# Patient Record
Sex: Female | Born: 1980 | Hispanic: Yes | Marital: Married | Smoking: Never smoker
Health system: Southern US, Community
[De-identification: ages and names within clinical notes are randomized; demographics above are authoritative.]

## PROBLEM LIST (undated history)

## (undated) DIAGNOSIS — I499 Cardiac arrhythmia, unspecified: Secondary | ICD-10-CM

## (undated) DIAGNOSIS — I10 Essential (primary) hypertension: Secondary | ICD-10-CM

## (undated) HISTORY — PX: SHOULDER SURGERY: SHX246

---

## 2006-01-30 ENCOUNTER — Emergency Department: Payer: Self-pay | Admitting: Emergency Medicine

## 2006-02-04 ENCOUNTER — Emergency Department: Payer: Self-pay | Admitting: Unknown Physician Specialty

## 2006-03-25 ENCOUNTER — Ambulatory Visit: Payer: Self-pay | Admitting: Family Medicine

## 2006-05-18 ENCOUNTER — Ambulatory Visit: Payer: Self-pay | Admitting: Family Medicine

## 2009-02-16 ENCOUNTER — Ambulatory Visit: Payer: Self-pay | Admitting: Family Medicine

## 2009-05-31 ENCOUNTER — Emergency Department: Payer: Self-pay | Admitting: Emergency Medicine

## 2009-12-11 ENCOUNTER — Emergency Department: Payer: Self-pay | Admitting: Emergency Medicine

## 2009-12-12 ENCOUNTER — Emergency Department: Payer: Self-pay | Admitting: Emergency Medicine

## 2010-07-11 ENCOUNTER — Ambulatory Visit: Payer: Self-pay | Admitting: Unknown Physician Specialty

## 2010-07-15 ENCOUNTER — Ambulatory Visit: Payer: Self-pay | Admitting: Unknown Physician Specialty

## 2012-07-28 ENCOUNTER — Emergency Department: Payer: Self-pay

## 2012-07-28 LAB — BASIC METABOLIC PANEL
Anion Gap: 6 — ABNORMAL LOW (ref 7–16)
BUN: 9 mg/dL (ref 7–18)
Calcium, Total: 8.8 mg/dL (ref 8.5–10.1)
Creatinine: 0.51 mg/dL — ABNORMAL LOW (ref 0.60–1.30)
EGFR (African American): >60
Glucose: 93 mg/dL (ref 65–99)
Sodium: 138 mmol/L (ref 136–145)

## 2012-07-28 LAB — CBC
HGB: 12.4 g/dL (ref 12.0–16.0)
MCV: 89 fL (ref 80–100)
Platelet: 267 10*3/uL (ref 150–440)
RBC: 4 10*6/uL (ref 3.80–5.20)
WBC: 4.8 10*3/uL (ref 3.6–11.0)

## 2012-07-28 LAB — TROPONIN I: Troponin-I: <0.02 ng/mL

## 2014-09-03 ENCOUNTER — Emergency Department: Payer: Self-pay | Admitting: Emergency Medicine

## 2014-09-03 LAB — COMPREHENSIVE METABOLIC PANEL
ALK PHOS: 64 U/L
ALT: 35 U/L
AST: 24 U/L (ref 15–37)
Albumin: 4.1 g/dL (ref 3.4–5.0)
Anion Gap: 10 (ref 7–16)
BILIRUBIN TOTAL: 0.3 mg/dL (ref 0.2–1.0)
BUN: 15 mg/dL (ref 7–18)
Calcium, Total: 8.9 mg/dL (ref 8.5–10.1)
Chloride: 108 mmol/L — ABNORMAL HIGH (ref 98–107)
Co2: 22 mmol/L (ref 21–32)
Creatinine: 0.6 mg/dL (ref 0.60–1.30)
Glucose: 96 mg/dL (ref 65–99)
Osmolality: 280 (ref 275–301)
POTASSIUM: 3.5 mmol/L (ref 3.5–5.1)
SODIUM: 140 mmol/L (ref 136–145)
Total Protein: 7.6 g/dL (ref 6.4–8.2)

## 2014-09-03 LAB — CBC
HCT: 38.3 % (ref 35.0–47.0)
HGB: 12.5 g/dL (ref 12.0–16.0)
MCH: 29.2 pg (ref 26.0–34.0)
MCHC: 32.6 g/dL (ref 32.0–36.0)
MCV: 90 fL (ref 80–100)
PLATELETS: 242 10*3/uL (ref 150–440)
RBC: 4.27 10*6/uL (ref 3.80–5.20)
RDW: 13.6 % (ref 11.5–14.5)
WBC: 5.8 10*3/uL (ref 3.6–11.0)

## 2014-09-03 LAB — D-DIMER(ARMC): D-Dimer: 177 ng/ml

## 2014-09-03 LAB — TROPONIN I: Troponin-I: <0.02 ng/mL

## 2014-10-06 ENCOUNTER — Emergency Department: Payer: Self-pay | Admitting: Emergency Medicine

## 2019-04-07 ENCOUNTER — Ambulatory Visit
Admission: RE | Admit: 2019-04-07 | Discharge: 2019-04-07 | Disposition: A | Discharge status: Home / Self Care | Payer: Self-pay | Source: Ambulatory Visit | Attending: Primary Care | Admitting: Primary Care

## 2019-04-07 ENCOUNTER — Other Ambulatory Visit: Payer: Self-pay | Admitting: Primary Care

## 2019-04-07 DIAGNOSIS — M25571 Pain in right ankle and joints of right foot: Secondary | ICD-10-CM

## 2019-06-09 ENCOUNTER — Other Ambulatory Visit: Payer: Self-pay

## 2019-06-09 DIAGNOSIS — Z20822 Contact with and (suspected) exposure to covid-19: Secondary | ICD-10-CM

## 2019-06-13 LAB — NOVEL CORONAVIRUS, NAA: SARS-CoV-2, NAA: DETECTED — AB

## 2019-09-08 ENCOUNTER — Encounter: Payer: Self-pay | Admitting: Emergency Medicine

## 2019-09-08 ENCOUNTER — Other Ambulatory Visit: Payer: Self-pay

## 2019-09-08 ENCOUNTER — Emergency Department: Payer: Self-pay

## 2019-09-08 ENCOUNTER — Emergency Department
Admission: EM | Admit: 2019-09-08 | Discharge: 2019-09-08 | Disposition: A | Discharge status: Home / Self Care | Payer: Self-pay | Attending: Emergency Medicine | Admitting: Emergency Medicine

## 2019-09-08 DIAGNOSIS — R519 Headache, unspecified: Secondary | ICD-10-CM | POA: Insufficient documentation

## 2019-09-08 DIAGNOSIS — I10 Essential (primary) hypertension: Secondary | ICD-10-CM | POA: Insufficient documentation

## 2019-09-08 DIAGNOSIS — R202 Paresthesia of skin: Secondary | ICD-10-CM | POA: Insufficient documentation

## 2019-09-08 DIAGNOSIS — R079 Chest pain, unspecified: Secondary | ICD-10-CM | POA: Insufficient documentation

## 2019-09-08 DIAGNOSIS — R531 Weakness: Secondary | ICD-10-CM | POA: Insufficient documentation

## 2019-09-08 DIAGNOSIS — M549 Dorsalgia, unspecified: Secondary | ICD-10-CM | POA: Insufficient documentation

## 2019-09-08 HISTORY — DX: Cardiac arrhythmia, unspecified: I49.9

## 2019-09-08 HISTORY — DX: Essential (primary) hypertension: I10

## 2019-09-08 LAB — CBC
HCT: 34.2 % — ABNORMAL LOW (ref 36.0–46.0)
Hemoglobin: 11.6 g/dL — ABNORMAL LOW (ref 12.0–15.0)
MCH: 29.7 pg (ref 26.0–34.0)
MCHC: 33.9 g/dL (ref 30.0–36.0)
MCV: 87.5 fL (ref 80.0–100.0)
Platelets: 268 10*3/uL (ref 150–400)
RBC: 3.91 MIL/uL (ref 3.87–5.11)
RDW: 13.5 % (ref 11.5–15.5)
WBC: 3.7 10*3/uL — ABNORMAL LOW (ref 4.0–10.5)
nRBC: 0 % (ref 0.0–0.2)

## 2019-09-08 LAB — BASIC METABOLIC PANEL
Anion gap: 9 (ref 5–15)
BUN: 10 mg/dL (ref 6–20)
CO2: 28 mmol/L (ref 22–32)
Calcium: 9 mg/dL (ref 8.9–10.3)
Chloride: 102 mmol/L (ref 98–111)
Creatinine, Ser: 0.74 mg/dL (ref 0.44–1.00)
GFR calc Af Amer: >60 mL/min (ref >60)
GFR calc non Af Amer: >60 mL/min (ref >60)
Glucose, Bld: 94 mg/dL (ref 70–99)
Potassium: 3.6 mmol/L (ref 3.5–5.1)
Sodium: 139 mmol/L (ref 135–145)

## 2019-09-08 LAB — POCT PREGNANCY, URINE: Preg Test, Ur: NEGATIVE

## 2019-09-08 MED ORDER — METOCLOPRAMIDE HCL 5 MG/ML IJ SOLN
10.0000 mg | Freq: Once | INTRAMUSCULAR | Status: AC
  Administered 2019-09-08: 13:00:00 10 mg via INTRAVENOUS
  Filled 2019-09-08: qty 2

## 2019-09-08 MED ORDER — SODIUM CHLORIDE 0.9 % IV SOLN
Freq: Once | INTRAVENOUS | Status: AC
  Administered 2019-09-08: 13:00:00 via INTRAVENOUS

## 2019-09-08 MED ORDER — SODIUM CHLORIDE 0.9% FLUSH: 3.0000 mL | Freq: Once | INTRAVENOUS | Status: DC

## 2019-09-08 MED ORDER — KETOROLAC TROMETHAMINE 30 MG/ML IJ SOLN
30.0000 mg | Freq: Once | INTRAMUSCULAR | Status: AC
  Administered 2019-09-08: 13:00:00 30 mg via INTRAVENOUS
  Filled 2019-09-08: qty 1

## 2019-09-08 MED ORDER — LORAZEPAM 1 MG PO TABS: 1.0000 mg | ORAL_TABLET | Freq: Two times a day (BID) | ORAL | 0 refills | Status: AC | PRN

## 2019-09-08 NOTE — ED Triage Notes (Signed)
Says left side headache stabbing for 2 days.  Also says she feels weak in left arm and left face.  She says she gets chest pains on and off too.  Speech is clear.  Says both legs a little numb.  No arm drift.  No facial droop.  Also told ems she had some back pain.

## 2019-09-08 NOTE — ED Provider Notes (Signed)
[facility] Emergency Department Provider Note       Time seen: ----------------------------------------- 11:38 AM on 09/08/2019 -----------------------------------------   I have reviewed the triage vital signs and the nursing notes.  HISTORY   Chief Complaint Weakness and Headache    HPI Melinda Morris is a 38 y.o. female with a history of hypertension and irregular heartbeat who presents to the ED for left-sided headache with stabbing for the past 2 days.  Patient also states she feels weak in the left side of her arm and face.  She gets chest pain on and off as well, says both legs are little numb.  Did report some back pain to EMS.  Discomfort is 8 out of 10.  Past Medical History:  Diagnosis Date  . Hypertension   . Irregular heart beat     There are no active problems to display for this patient.   Past Surgical History:  Procedure Laterality Date  . SHOULDER SURGERY Left     Allergies Patient has no known allergies.  Social History Social History   Tobacco Use  . Smoking status: Never Smoker  . Smokeless tobacco: Never Used  Substance Use Topics  . Alcohol use: Yes  . Drug use: Not on file    Review of Systems Constitutional: Negative for fever. Cardiovascular: Positive for chest pain Respiratory: Negative for shortness of breath. Gastrointestinal: Negative for abdominal pain, vomiting and diarrhea. Musculoskeletal: Positive for back pain Skin: Negative for rash. Neurological: Positive for headache, weakness, numbness  All systems negative/normal/unremarkable except as stated in the HPI  ____________________________________________   PHYSICAL EXAM:  VITAL SIGNS: ED Triage Vitals  Enc Vitals Group     BP 09/08/19 1054 115/78     Pulse Rate 09/08/19 1054 67     Resp 09/08/19 1054 14     Temp 09/08/19 1054 98.5 F (36.9 C)     Temp Source 09/08/19 1054 Oral     SpO2 09/08/19 1054 97 %     Weight 09/08/19  1055 153 lb (69.4 kg)     Height 09/08/19 1055 5\' 3"  (1.6 m)     Head Circumference --      Peak Flow --      Pain Score 09/08/19 1055 8     Pain Loc --      Pain Edu? --      Excl. in [place]? --    Constitutional: Alert and oriented. Well appearing and in no distress. Eyes: Conjunctivae are normal. Normal extraocular movements. ENT      Head: Normocephalic and atraumatic.      Nose: No congestion/rhinnorhea.      Mouth/Throat: Mucous membranes are moist.      Neck: No stridor. Cardiovascular: Normal rate, regular rhythm. No murmurs, rubs, or gallops. Respiratory: Normal respiratory effort without tachypnea nor retractions. Breath sounds are clear and equal bilaterally. No wheezes/rales/rhonchi. Gastrointestinal: Soft and nontender. Normal bowel sounds Musculoskeletal: Nontender with normal range of motion in extremities. No lower extremity tenderness nor edema. Neurologic:  Normal speech and language. No gross focal neurologic deficits are appreciated.  Skin:  Skin is warm, dry and intact. No rash noted. Psychiatric: Mood and affect are normal. Speech and behavior are normal.  ____________________________________________  EKG: Interpreted by me.  Sinus rhythm with a rate of 63 bpm, normal PR interval, normal QRS, normal QT  ____________________________________________  ED COURSE:  As part of my medical decision making, I reviewed the following data within the [place]  History obtained from family if available, nursing notes, old chart and ekg, as well as notes from prior ED visits. Patient presented for headache with numbness and weakness, we will assess with labs and imaging as indicated at this time.   Procedures  Melinda Morris was evaluated in Emergency Department on 09/08/2019 for the symptoms described in the history of present illness. She was evaluated in the context of the global COVID-19 pandemic, which necessitated consideration that the patient  might be at risk for infection with the SARS-CoV-2 virus that causes COVID-19. Institutional protocols and algorithms that pertain to the evaluation of patients at risk for COVID-19 are in a state of rapid change based on information released by regulatory bodies including the CDC and federal and state organizations. These policies and algorithms were followed during the patient's care in the ED.  ____________________________________________   LABS (pertinent positives/negatives)  Labs Reviewed  CBC - Abnormal; Notable for the following components:      Result Value   WBC 3.7 (*)    Hemoglobin 11.6 (*)    HCT 34.2 (*)    All other components within normal limits  BASIC METABOLIC PANEL  POC URINE PREG, ED  POCT PREGNANCY, URINE    RADIOLOGY Images were viewed by me  CT head IMPRESSION:  Mucosal thickening in several ethmoid air cells. Study otherwise  unremarkable.  ____________________________________________   DIFFERENTIAL DIAGNOSIS   CVA, TIA, anxiety, migraine  FINAL ASSESSMENT AND PLAN  Headache, numbness, weakness   Plan: The patient had presented for headache with numbness and weakness. Patient's labs were unremarkable. Patient's imaging did not reveal any acute process.  Currently she is feeling better and has no numbness or paresthesias.  She appears cleared for close outpatient follow-up with her doctor.   Ulice Dash, MD    Note: This note was generated in part or whole with voice recognition software. Voice recognition is usually quite accurate but there are transcription errors that can and very often do occur. I apologize for any typographical errors that were not detected and corrected.     Emily Filbert, MD 09/08/19 1415

## 2019-09-08 NOTE — ED Notes (Signed)
Patient transported to CT 

## 2019-12-01 ENCOUNTER — Other Ambulatory Visit
Admission: RE | Admit: 2019-12-01 | Discharge: 2019-12-01 | Disposition: A | Discharge status: Home / Self Care | Payer: Self-pay | Source: Ambulatory Visit | Attending: Pediatrics | Admitting: Pediatrics

## 2019-12-01 DIAGNOSIS — R0602 Shortness of breath: Secondary | ICD-10-CM | POA: Insufficient documentation

## 2019-12-01 DIAGNOSIS — R5383 Other fatigue: Secondary | ICD-10-CM | POA: Insufficient documentation

## 2019-12-01 DIAGNOSIS — R42 Dizziness and giddiness: Secondary | ICD-10-CM | POA: Insufficient documentation

## 2019-12-01 LAB — FIBRIN DERIVATIVES D-DIMER (ARMC ONLY): Fibrin derivatives D-dimer (ARMC): 398.91 ng/mL (FEU) (ref 0.00–499.00)

## 2020-05-17 ENCOUNTER — Observation Stay (HOSPITAL_BASED_OUTPATIENT_CLINIC_OR_DEPARTMENT_OTHER)
Admit: 2020-05-17 | Discharge: 2020-05-17 | Disposition: A | Discharge status: Home / Self Care | Payer: Self-pay | Attending: Internal Medicine | Admitting: Internal Medicine

## 2020-05-17 ENCOUNTER — Observation Stay: Payer: Self-pay

## 2020-05-17 ENCOUNTER — Encounter: Payer: Self-pay | Admitting: Radiology

## 2020-05-17 ENCOUNTER — Observation Stay
Admission: EM | Admit: 2020-05-17 | Discharge: 2020-05-18 | Disposition: A | Discharge status: Home / Self Care | Payer: Self-pay | Attending: Internal Medicine | Admitting: Internal Medicine

## 2020-05-17 ENCOUNTER — Emergency Department: Payer: Self-pay

## 2020-05-17 ENCOUNTER — Other Ambulatory Visit: Payer: Self-pay

## 2020-05-17 DIAGNOSIS — E785 Hyperlipidemia, unspecified: Secondary | ICD-10-CM

## 2020-05-17 DIAGNOSIS — Z20822 Contact with and (suspected) exposure to covid-19: Secondary | ICD-10-CM | POA: Insufficient documentation

## 2020-05-17 DIAGNOSIS — F419 Anxiety disorder, unspecified: Secondary | ICD-10-CM

## 2020-05-17 DIAGNOSIS — R29898 Other symptoms and signs involving the musculoskeletal system: Secondary | ICD-10-CM | POA: Insufficient documentation

## 2020-05-17 DIAGNOSIS — G459 Transient cerebral ischemic attack, unspecified: Principal | ICD-10-CM | POA: Diagnosis present

## 2020-05-17 DIAGNOSIS — Z8249 Family history of ischemic heart disease and other diseases of the circulatory system: Secondary | ICD-10-CM | POA: Insufficient documentation

## 2020-05-17 DIAGNOSIS — I10 Essential (primary) hypertension: Secondary | ICD-10-CM | POA: Diagnosis present

## 2020-05-17 DIAGNOSIS — Z79899 Other long term (current) drug therapy: Secondary | ICD-10-CM | POA: Insufficient documentation

## 2020-05-17 DIAGNOSIS — I6389 Other cerebral infarction: Secondary | ICD-10-CM

## 2020-05-17 DIAGNOSIS — R200 Anesthesia of skin: Secondary | ICD-10-CM

## 2020-05-17 LAB — COMPREHENSIVE METABOLIC PANEL
ALT: 29 U/L (ref 0–44)
AST: 22 U/L (ref 15–41)
Albumin: 4.8 g/dL (ref 3.5–5.0)
Alkaline Phosphatase: 55 U/L (ref 38–126)
Anion gap: 10 (ref 5–15)
BUN: 10 mg/dL (ref 6–20)
CO2: 24 mmol/L (ref 22–32)
Calcium: 9.2 mg/dL (ref 8.9–10.3)
Chloride: 100 mmol/L (ref 98–111)
Creatinine, Ser: 0.6 mg/dL (ref 0.44–1.00)
GFR calc Af Amer: >60 mL/min (ref >60)
GFR calc non Af Amer: >60 mL/min (ref >60)
Glucose, Bld: 94 mg/dL (ref 70–99)
Potassium: 3.7 mmol/L (ref 3.5–5.1)
Sodium: 134 mmol/L — ABNORMAL LOW (ref 135–145)
Total Bilirubin: 0.7 mg/dL (ref 0.3–1.2)
Total Protein: 7.9 g/dL (ref 6.5–8.1)

## 2020-05-17 LAB — DIFFERENTIAL
Abs Immature Granulocytes: 0.02 10*3/uL (ref 0.00–0.07)
Basophils Absolute: 0 10*3/uL (ref 0.0–0.1)
Basophils Relative: 1 %
Eosinophils Absolute: 0.1 10*3/uL (ref 0.0–0.5)
Eosinophils Relative: 1 %
Immature Granulocytes: 0 %
Lymphocytes Relative: 39 %
Lymphs Abs: 2.4 10*3/uL (ref 0.7–4.0)
Monocytes Absolute: 0.5 10*3/uL (ref 0.1–1.0)
Monocytes Relative: 8 %
Neutro Abs: 3.1 10*3/uL (ref 1.7–7.7)
Neutrophils Relative %: 51 %

## 2020-05-17 LAB — CBC
HCT: 38.9 % (ref 36.0–46.0)
Hemoglobin: 13.4 g/dL (ref 12.0–15.0)
MCH: 30.5 pg (ref 26.0–34.0)
MCHC: 34.4 g/dL (ref 30.0–36.0)
MCV: 88.6 fL (ref 80.0–100.0)
Platelets: 279 10*3/uL (ref 150–400)
RBC: 4.39 MIL/uL (ref 3.87–5.11)
RDW: 13.3 % (ref 11.5–15.5)
WBC: 6 10*3/uL (ref 4.0–10.5)
nRBC: 0 % (ref 0.0–0.2)

## 2020-05-17 LAB — ECHOCARDIOGRAM COMPLETE
Height: 64 in
Weight: 2240 oz

## 2020-05-17 LAB — PROTIME-INR
INR: 1 (ref 0.8–1.2)
Prothrombin Time: 12.4 seconds (ref 11.4–15.2)

## 2020-05-17 LAB — SARS CORONAVIRUS 2 BY RT PCR (HOSPITAL ORDER, PERFORMED IN ~~LOC~~ HOSPITAL LAB): SARS Coronavirus 2: NEGATIVE

## 2020-05-17 LAB — APTT: aPTT: 31 seconds (ref 24–36)

## 2020-05-17 LAB — GLUCOSE, CAPILLARY: Glucose-Capillary: 93 mg/dL (ref 70–99)

## 2020-05-17 MED ORDER — ASPIRIN EC 325 MG PO TBEC
325.0000 mg | DELAYED_RELEASE_TABLET | Freq: Every day | ORAL | Status: DC
  Administered 2020-05-17 – 2020-05-18 (×2): 325 mg via ORAL
  Filled 2020-05-17 (×2): qty 1

## 2020-05-17 MED ORDER — ATORVASTATIN CALCIUM 20 MG PO TABS
80.0000 mg | ORAL_TABLET | Freq: Every day | ORAL | Status: DC
  Administered 2020-05-17 – 2020-05-18 (×2): 80 mg via ORAL
  Filled 2020-05-17 (×2): qty 4

## 2020-05-17 MED ORDER — ASPIRIN 300 MG RE SUPP: 300.0000 mg | Freq: Every day | RECTAL | Status: DC

## 2020-05-17 MED ORDER — ACETAMINOPHEN 650 MG RE SUPP: 650.0000 mg | RECTAL | Status: DC | PRN

## 2020-05-17 MED ORDER — IOHEXOL 350 MG/ML SOLN
75.0000 mL | Freq: Once | INTRAVENOUS | Status: AC | PRN
  Administered 2020-05-17: 75 mL via INTRAVENOUS

## 2020-05-17 MED ORDER — ACETAMINOPHEN 160 MG/5ML PO SOLN
650.0000 mg | ORAL | Status: DC | PRN
  Filled 2020-05-17: qty 20.3

## 2020-05-17 MED ORDER — STROKE: EARLY STAGES OF RECOVERY BOOK: Freq: Once | Status: AC

## 2020-05-17 MED ORDER — SENNOSIDES-DOCUSATE SODIUM 8.6-50 MG PO TABS: 1.0000 | ORAL_TABLET | Freq: Every evening | ORAL | Status: DC | PRN

## 2020-05-17 MED ORDER — SODIUM CHLORIDE 0.9% FLUSH: 3.0000 mL | Freq: Once | INTRAVENOUS | Status: DC

## 2020-05-17 MED ORDER — ACETAMINOPHEN 325 MG PO TABS: 650.0000 mg | ORAL_TABLET | ORAL | Status: DC | PRN

## 2020-05-17 NOTE — ED Provider Notes (Signed)
[facility] Emergency Department Provider Note ____________________________________________   First MD Initiated Contact with Patient 05/17/20 [phone]     (approximate)  I have reviewed the triage vital signs and the nursing notes.   HISTORY  Chief Complaint Numbness    HPI Melinda Morris is a 39 y.o. female with PMH as noted below who presents with numbness to the lower lip as well as to the left arm and leg, acute onset this morning around 8 AM, and persistent since then.  She has no associated weakness.  She denies any prior history of the symptoms.  Past Medical History:  Diagnosis Date  . Hypertension   . Irregular heart beat     There are no problems to display for this patient.   Past Surgical History:  Procedure Laterality Date  . SHOULDER SURGERY Left     Prior to Admission medications   Medication Sig Start Date End Date Taking? Authorizing Provider  LORazepam (ATIVAN) 1 MG tablet Take 1 tablet (1 mg total) by mouth 2 (two) times daily as needed for anxiety. 09/08/19 09/07/20 Yes Earleen Newport, MD  metoprolol succinate (TOPROL-XL) 25 MG 24 hr tablet Take by mouth. Patient not taking: Reported on 05/17/2020 05/16/16   [provider]    Allergies Patient has no known allergies.  History reviewed. No pertinent family history.  Social History Social History   Tobacco Use  . Smoking status: Never Smoker  . Smokeless tobacco: Never Used  Substance Use Topics  . Alcohol use: Yes  . Drug use: Not on file    Review of Systems  Constitutional: No fever/chills. Eyes: No visual changes. ENT: No neck pain. Cardiovascular: Denies chest pain. Respiratory: Denies shortness of breath. Gastrointestinal: No vomiting. Genitourinary: Negative for flank pain. Musculoskeletal: Negative for back pain. Skin: Negative for rash. Neurological: Positive for left-sided headache as well as left-sided  numbness.   ____________________________________________   PHYSICAL EXAM:  VITAL SIGNS: ED Triage Vitals  Enc Vitals Group     BP 05/17/20 0908 (!) 137/92     Pulse Rate 05/17/20 0908 83     Resp 05/17/20 0908 16     Temp 05/17/20 0908 98.8 F (37.1 C)     Temp Source 05/17/20 0908 Oral     SpO2 05/17/20 0908 100 %     Weight 05/17/20 0931 140 lb (63.5 kg)     Height 05/17/20 0931 5\' 4"  (1.626 m)     Head Circumference --      Peak Flow --      Pain Score 05/17/20 0931 8     Pain Loc --      Pain Edu? --      Excl. in [place]? --     Constitutional: Alert and oriented. Well appearing and in no acute distress. Eyes: Conjunctivae are normal.  Head: Atraumatic. Nose: No congestion/rhinnorhea. Mouth/Throat: Mucous membranes are moist.   Neck: Normal range of motion.  Cardiovascular: Normal rate, regular rhythm.  Good peripheral circulation. Respiratory: Normal respiratory effort.  No retractions.  Gastrointestinal: No distention.  Musculoskeletal: No lower extremity edema.  Extremities warm and well perfused.  Neurologic:  Normal speech and language.  5/5 motor strength to bilateral upper and lower extremities.  No pronator drift.  No ataxia.  No facial droop.  Subjective decreased sensation to the left upper extremity. Skin:  Skin is warm and dry. No rash noted. Psychiatric: Mood and affect are normal. Speech and behavior are normal.  ____________________________________________  LABS (all labs ordered are listed, but only abnormal results are displayed)  Labs Reviewed  COMPREHENSIVE METABOLIC PANEL - Abnormal; Notable for the following components:      Result Value   Sodium 134 (*)    All other components within normal limits  SARS CORONAVIRUS 2 BY RT PCR (HOSPITAL ORDER, PERFORMED IN [facility])  PROTIME-INR  APTT  CBC  DIFFERENTIAL  GLUCOSE, CAPILLARY  CBG MONITORING, ED  POC URINE PREG, ED    ____________________________________________  EKG  ED ECG REPORT I, Dionne Bucy, the attending physician, personally viewed and interpreted this ECG.  Date: 05/17/2020 EKG Time: 0930 Rate: 77 Rhythm: normal sinus rhythm QRS Axis: normal Intervals: normal ST/T Wave abnormalities: normal Narrative Interpretation: no evidence of acute ischemia  ____________________________________________  RADIOLOGY  CT head: No ICH or other acute abnormality CT angio head and neck: No large vessel occlusion ____________________________________________   PROCEDURES  Procedure(s) performed: No  Procedures  Critical Care performed: No ____________________________________________   INITIAL IMPRESSION / ASSESSMENT AND PLAN / ED COURSE  Pertinent labs & imaging results that were available during my care of the patient were reviewed by me and considered in my medical decision making (see chart for details).  39year-old female with no prior stroke history presents with acute onset of left-sided paresthesias in the left lip and mainly in the left arm although she states it involves the leg as well.  She has no motor weakness.  She denies any prior history of the symptoms.  On exam, the patient is overall well-appearing.  Her vital signs are normal.  Neurologic exam is as above with only subjective sensory symptoms.  Differential includes stroke, atypical migraine, peripheral neuropathy/radiculopathy, or other benign etiology.  Code stroke was activated and a CT head was obtained which is negative.  Dr. Thad Ranger evaluated the patient in the ED and I consulted with her.  She does not recommend TPA.  CT angio head and neck are also negative, Dr. Thad Ranger recommended admission for further work-up.  ----------------------------------------- 11:18 AM on 05/17/2020 -----------------------------------------  The patient reports persistent sensory symptoms.  There is no change in her exam.   I discussed the case with Dr. Joylene Igo from the hospitalist service for admission.  ____________________________________________   FINAL CLINICAL IMPRESSION(S) / ED DIAGNOSES  Final diagnoses:  Numbness      NEW MEDICATIONS STARTED DURING THIS VISIT:  New Prescriptions   No medications on file     Note:  This document was prepared using Dragon voice recognition software and may include unintentional dictation errors.    Dionne Bucy, MD 05/17/20 1118

## 2020-05-17 NOTE — Progress Notes (Signed)
SLP Cancellation Note  Patient Details Name: Melinda Morris MRN: [id number] DOB: 1981/02/03   Cancelled treatment:       Reason Eval/Treat Not Completed: SLP screened, no needs identified, will sign off (chart reviewed; consulted NSG then met w/ pt in room/ED). Pt denied any difficulty swallowing and is currently on a regular diet per NSG; tolerates swallowing pills w/ water per NSG. Pt gave her Dinner meal request to SLP who called Dining services. Pt conversed at conversational level w/out deficits noted; pt and NSG denied any speech-language deficits. Pt conversed in quite adequate [place] w/ SLP and stated she was able to text and talk on phone w/out deficits.  No further skilled ST services indicated as pt appears at her baseline. Pt agreed. NSG to reconsult if any new issues arise while admitted.    Orinda Kenner, MS, CCC-SLP Ronnett Pullin 05/17/2020, 4:22 PM

## 2020-05-17 NOTE — ED Notes (Signed)
Pt assisted to the bathroom at this time.

## 2020-05-17 NOTE — Progress Notes (Signed)
CODE STROKE- PHARMACY COMMUNICATION   Time CODE STROKE called/page received: 0908  Time response to CODE STROKE was made (in person or via phone): 0920  Time Stroke Kit retrieved from [employer]: 0920  Name of Provider/Nurse contacted: Dr. Doy Mince - no tPA    Tawnya Crook ,PharmD Clinical Pharmacist  05/17/2020  11:07 AM

## 2020-05-17 NOTE — Progress Notes (Signed)
*  PRELIMINARY RESULTS* Echocardiogram 2D Echocardiogram has been performed.  Cristela Blue 05/17/2020, 1:36 PM

## 2020-05-17 NOTE — ED Notes (Signed)
Admitting MD at bedside.

## 2020-05-17 NOTE — ED Notes (Signed)
Pt's blood tubes white labeled due to pt being a code stroke and pt's chart on [employer] manager was locked.

## 2020-05-17 NOTE — Consult Note (Signed)
Requesting Physician: Siadecki    Chief Complaint: Left sided numbness  I have been asked by Dr. Marisa Severin to see this patient in consultation for code stroke.  HPI: Melinda Morris is an 39 y.o. female with a history of HTN and irregulat heart beat who reports that she was at work today and began to have palpitations and dizziness.  Went to check her BP and it was mildly elevated.  At that point patient noted headache (left sided), numbness on the left side of her face, left arm and left leg.  Patient presented for evaluation at that time.  Initial NIHSS of 1.    Date last known well: 05/17/2020 Time last known well: Time: 08:00 tPA Given: No: Nondisabling symptoms  Past Medical History:  Diagnosis Date   Hypertension    Irregular heart beat     Past Surgical History:  Procedure Laterality Date   SHOULDER SURGERY Left     Family history: Both parents living.  Mother with DM and HTN  Social History:  reports that she has never smoked. She has never used smokeless tobacco. She reports current alcohol use. No history on file for drug use.  Allergies: No Known Allergies  Medications: I have reviewed the patient's current medications.   Prior to Admission medications   Medication Sig Start Date End Date Taking? Authorizing Provider  LORazepam (ATIVAN) 1 MG tablet Take 1 tablet (1 mg total) by mouth 2 (two) times daily as needed for anxiety. 09/08/19 09/07/20 Yes Emily Filbert, MD  metoprolol succinate (TOPROL-XL) 25 MG 24 hr tablet Take by mouth. Patient not taking: Reported on 05/17/2020 05/16/16   [provider]    ROS: History obtained from the patient  General ROS: negative for - chills, fatigue, fever, night sweats, weight gain or weight loss Psychological ROS: negative for - behavioral disorder, hallucinations, memory difficulties, mood swings or suicidal ideation Ophthalmic ROS: negative for - blurry vision, double vision, eye pain or loss of  vision ENT ROS: negative for - epistaxis, nasal discharge, oral lesions, sore throat, tinnitus or vertigo Allergy and Immunology ROS: negative for - hives or itchy/watery eyes Hematological and Lymphatic ROS: negative for - bleeding problems, bruising or swollen lymph nodes Endocrine ROS: negative for - galactorrhea, hair pattern changes, polydipsia/polyuria or temperature intolerance Respiratory ROS: negative for - cough, hemoptysis, shortness of breath or wheezing Cardiovascular ROS: palpitations Gastrointestinal ROS: negative for - abdominal pain, diarrhea, hematemesis, nausea/vomiting or stool incontinence Genito-Urinary ROS: negative for - dysuria, hematuria, incontinence or urinary frequency/urgency Musculoskeletal ROS: negative for - joint swelling or muscular weakness Neurological ROS: as noted in HPI Dermatological ROS: negative for rash and skin lesion changes  Physical Examination: Blood pressure (!) 137/92, pulse 83, temperature 98.8 F (37.1 C), temperature source Oral, resp. rate 16, height 5\' 4"  (1.626 m), weight 63.5 kg, last menstrual period 04/17/2020, SpO2 100 %.  HEENT-  Normocephalic, no lesions, without obvious abnormality.  Normal external eye and conjunctiva.  Normal TM's bilaterally.  Normal auditory canals and external ears. Normal external nose, mucus membranes and septum.  Normal pharynx. Cardiovascular- S1, S2 normal, pulses palpable throughout   Lungs- chest clear, no wheezing, rales, normal symmetric air entry Abdomen- soft, non-tender; bowel sounds normal; no masses,  no organomegaly Extremities- no edema Lymph-no adenopathy palpable Musculoskeletal-no joint tenderness, deformity or swelling Skin-warm and dry, no hyperpigmentation, vitiligo, or suspicious lesions  Neurological Examination   Mental Status: Alert, oriented, thought content appropriate.  Speech fluent without evidence of aphasia.  Able to follow 3 step commands without difficulty. Cranial  Nerves: II: Discs flat bilaterally; Visual fields grossly normal, pupils equal, round, reactive to light and accommodation III,IV, VI: ptosis not present, extra-ocular motions intact bilaterally V,VII: smile symmetric, facial light touch sensation decreased on the left VIII: hearing normal bilaterally IX,X: gag reflex present XI: bilateral shoulder shrug XII: midline tongue extension Motor: Right : Upper extremity   5/5    Left:     Upper extremity   5/5  Lower extremity   5/5     Lower extremity   5/5 Tone and bulk:normal tone throughout; no atrophy noted Sensory: Pinprick and light touch decreased in the left upper and lower extremities Deep Tendon Reflexes: Symmetric throughout Plantars: Right: mute   Left: mute Cerebellar: Normal finger-to-nose and normal heel-to-shin testing bilaterally Gait: normal gait and station   Laboratory Studies:  Basic Metabolic Panel: Recent Labs  Lab 05/17/20 0908  NA 134*  K 3.7  CL 100  CO2 24  GLUCOSE 94  BUN 10  CREATININE 0.60  CALCIUM 9.2    Liver Function Tests: Recent Labs  Lab 05/17/20 0908  AST 22  ALT 29  ALKPHOS 55  BILITOT 0.7  PROT 7.9  ALBUMIN 4.8   No results for input(s): LIPASE, AMYLASE in the last 168 hours. No results for input(s): AMMONIA in the last 168 hours.  CBC: Recent Labs  Lab 05/17/20 0908  WBC 6.0  NEUTROABS 3.1  HGB 13.4  HCT 38.9  MCV 88.6  PLT 279    Cardiac Enzymes: No results for input(s): CKTOTAL, CKMB, CKMBINDEX, TROPONINI in the last 168 hours.  BNP: Invalid input(s): POCBNP  CBG: Recent Labs  Lab 05/17/20 0907  GLUCAP 93    Microbiology: Results for orders placed or performed in visit on 06/09/19  Novel Coronavirus, NAA ([facility])     Status: Abnormal   Collection Time: 06/09/19 12:00 AM  Result Value Ref Range Status   SARS-CoV-2, NAA Detected (A) Not Detected Final    Comment: Testing was performed using the Aptima SARS-CoV-2 assay. This test was developed and its  performance characteristics determined by [employer]. This test has not been FDA cleared or approved. This test has been authorized by FDA under an Emergency Use Authorization (EUA). This test is only authorized for the duration of time the declaration that circumstances exist justifying the authorization of the emergency use of in vitro diagnostic tests for detection of SARS-CoV-2 virus and/or diagnosis of COVID-19 infection under section 564(b)(1) of the Act, 21 U.S.C. 956LOV-5(I)(4), unless the authorization is terminated or revoked sooner. When diagnostic testing is negative, the possibility of a false negative result should be considered in the context of a patient's recent exposures and the presence of clinical signs and symptoms consistent with COVID-19. An individual without symptoms of COVID-19 and who is not shedding SARS-CoV-2 virus would expect to have a negativ e (not detected) result in this assay.     Coagulation Studies: Recent Labs    05/17/20 0908  LABPROT 12.4  INR 1.0    Urinalysis: No results for input(s): COLORURINE, LABSPEC, PHURINE, GLUCOSEU, HGBUR, BILIRUBINUR, KETONESUR, PROTEINUR, UROBILINOGEN, NITRITE, LEUKOCYTESUR in the last 168 hours.  Invalid input(s): APPERANCEUR  Lipid Panel: No results found for: CHOL, TRIG, HDL, CHOLHDL, VLDL, LDLCALC  HgbA1C: No results found for: HGBA1C  Urine Drug Screen:  No results found for: LABOPIA, COCAINSCRNUR, LABBENZ, AMPHETMU, THCU, LABBARB  Alcohol Level: No results for input(s): ETH in the last 168 hours.  Other results: EKG: sinus rhythm at 77 bpm.  Imaging: CT ANGIO HEAD W OR WO CONTRAST  Result Date: 05/17/2020 CLINICAL DATA:  Stroke code.  Left-sided numbness since 8 a.m. EXAM: CT ANGIOGRAPHY HEAD AND NECK TECHNIQUE: Multidetector CT imaging of the head and neck was performed using the standard protocol during bolus administration of intravenous contrast. Multiplanar CT image reconstructions  and MIPs were obtained to evaluate the vascular anatomy. Carotid stenosis measurements (when applicable) are obtained utilizing NASCET criteria, using the distal internal carotid diameter as the denominator. CONTRAST:  75mL OMNIPAQUE IOHEXOL 350 MG/ML SOLN COMPARISON:  Concurrently performed noncontrast head CT. FINDINGS: CTA NECK FINDINGS Aortic arch: Common origin of the innominate and left common carotid arteries. No hemodynamically significant innominate or proximal subclavian artery stenosis. Right carotid system: CCA and ICA patent within the neck without stenosis. Left carotid system: Streak artifact from a dense left-sided contrast bolus limits evaluation of the origin of the left common carotid artery. Within this limitation, the CCA and ICA are patent within the neck without stenosis. Vertebral arteries: Codominant and patent within the neck without stenosis. Skeleton: No acute bony abnormality or aggressive osseous lesion. Other neck: No neck mass or cervical lymphadenopathy. Thyroid unremarkable. Upper chest: No consolidation within the imaged lung apices. Review of the MIP images confirms the above findings CTA HEAD FINDINGS Anterior circulation: The intracranial internal carotid arteries are patent without significant stenosis. The M1 middle cerebral arteries are patent without significant stenosis. No M2 proximal branch occlusion or high-grade proximal stenosis is identified. The anterior cerebral arteries are patent bilaterally without significant proximal stenosis. No intracranial aneurysm is identified. Posterior circulation: The intracranial vertebral arteries are patent without significant stenosis, as is the basilar artery. The posterior cerebral arteries are patent bilaterally without significant proximal stenosis. Hypoplastic right P1 segment with a sizable right posterior communicating artery. The left posterior communicating artery is hypoplastic or absent. Venous sinuses: Within limitations  of contrast timing, no convincing thrombus. Anatomic variants: As described Review of the MIP images confirms the above findings These results were called by telephone at the time of interpretation on 05/17/2020 at 9:42 am to provider Dr. Marisa SeverinSiadecki, who verbally acknowledged these results. IMPRESSION: CTA neck: Streak artifact from a dense left-sided contrast bolus limits evaluation of the origin of the left CCA. Within this limitation, the common carotid, internal carotid and vertebral arteries are patent within the neck without hemodynamically significant stenosis. CTA head: No evidence of intracranial large vessel occlusion or proximal high-grade arterial stenosis. Electronically Signed   By: Jackey LogeKyle  Golden DO   On: 05/17/2020 09:42   CT ANGIO NECK W OR WO CONTRAST  Result Date: 05/17/2020 CLINICAL DATA:  Stroke code.  Left-sided numbness since 8 a.m. EXAM: CT ANGIOGRAPHY HEAD AND NECK TECHNIQUE: Multidetector CT imaging of the head and neck was performed using the standard protocol during bolus administration of intravenous contrast. Multiplanar CT image reconstructions and MIPs were obtained to evaluate the vascular anatomy. Carotid stenosis measurements (when applicable) are obtained utilizing NASCET criteria, using the distal internal carotid diameter as the denominator. CONTRAST:  75mL OMNIPAQUE IOHEXOL 350 MG/ML SOLN COMPARISON:  Concurrently performed noncontrast head CT. FINDINGS: CTA NECK FINDINGS Aortic arch: Common origin of the innominate and left common carotid arteries. No hemodynamically significant innominate or proximal subclavian artery stenosis. Right carotid system: CCA and ICA patent within the neck without stenosis. Left carotid system: Streak artifact from a dense left-sided contrast bolus limits evaluation of the origin of the left common carotid artery.  Within this limitation, the CCA and ICA are patent within the neck without stenosis. Vertebral arteries: Codominant and patent within the  neck without stenosis. Skeleton: No acute bony abnormality or aggressive osseous lesion. Other neck: No neck mass or cervical lymphadenopathy. Thyroid unremarkable. Upper chest: No consolidation within the imaged lung apices. Review of the MIP images confirms the above findings CTA HEAD FINDINGS Anterior circulation: The intracranial internal carotid arteries are patent without significant stenosis. The M1 middle cerebral arteries are patent without significant stenosis. No M2 proximal branch occlusion or high-grade proximal stenosis is identified. The anterior cerebral arteries are patent bilaterally without significant proximal stenosis. No intracranial aneurysm is identified. Posterior circulation: The intracranial vertebral arteries are patent without significant stenosis, as is the basilar artery. The posterior cerebral arteries are patent bilaterally without significant proximal stenosis. Hypoplastic right P1 segment with a sizable right posterior communicating artery. The left posterior communicating artery is hypoplastic or absent. Venous sinuses: Within limitations of contrast timing, no convincing thrombus. Anatomic variants: As described Review of the MIP images confirms the above findings These results were called by telephone at the time of interpretation on 05/17/2020 at 9:42 am to provider Dr. Marisa Severin, who verbally acknowledged these results. IMPRESSION: CTA neck: Streak artifact from a dense left-sided contrast bolus limits evaluation of the origin of the left CCA. Within this limitation, the common carotid, internal carotid and vertebral arteries are patent within the neck without hemodynamically significant stenosis. CTA head: No evidence of intracranial large vessel occlusion or proximal high-grade arterial stenosis. Electronically Signed   By: Jackey Loge DO   On: 05/17/2020 09:42   CT HEAD CODE STROKE WO CONTRAST  Result Date: 05/17/2020 CLINICAL DATA:  Code stroke. Left-sided numbness  beginning at 8 a.m. Possible stroke; neuro deficit, acute, stroke suspected. EXAM: CT HEAD WITHOUT CONTRAST TECHNIQUE: Contiguous axial images were obtained from the base of the skull through the vertex without intravenous contrast. COMPARISON:  Head CT 09/08/2019. FINDINGS: Brain: Cerebral volume is normal for age. There is no acute intracranial hemorrhage. No demarcated cortical infarct is identified. No extra-axial fluid collection. No evidence of intracranial mass. No midline shift. Vascular: No hyperdense vessel. Skull: Normal. Negative for fracture or focal lesion. Sinuses/Orbits: Visualized orbits show no acute finding. No significant paranasal sinus disease or mastoid effusion at the imaged levels. ASPECTS ([place] Stroke Program Early CT Score) - Ganglionic level infarction (caudate, lentiform nuclei, internal capsule, insula, M1-M3 cortex): 7 - Supraganglionic infarction (M4-M6 cortex): 3 Total score (0-10 with 10 being normal): 10 These results were called by telephone at the time of interpretation on 05/17/2020 at 9:28 am to provider Dr. Marisa Severin, who verbally acknowledged these results. IMPRESSION: No evidence of acute intracranial hemorrhage or acute demarcated cortical infarction. ASPECTS is 10. Electronically Signed   By: Jackey Loge DO   On: 05/17/2020 09:28    Assessment: 39 y.o. female with a history of HTN and irregulat heart beat presents with acute onset left sided numbness and headache.  Concern is for acute infarct.  Head CT personally reviewed and shows no acute changes.  Nondisabling symptoms noted on neurological examination therefore patient not considered a tPA candidate.  CTA performed to rule out vertebrobasilar insufficieny.  CTA personally reviewed and shows no evidence of LVO or vertebrobasilar insufficiency.  Symptoms persist therefore further work up recommended.      Stroke Risk Factors - hypertension  Plan: 1. HgbA1c, fasting lipid panel, hypercoag panel 2. MRI of the  brain without contrast 3. PT  consult, OT consult, Speech consult 4. Echocardiogram with bubble study 5. Prophylactic therapy-Antiplatelet med: Aspirin - dose 81mg  daily 6. NPO until RN stroke swallow screen 7. Telemetry monitoring 8. Frequent neuro checks   , MD Neurology [phone] 05/17/2020, 10:41 AM

## 2020-05-17 NOTE — ED Triage Notes (Signed)
C/O lower lip numbness and left arm numbness this morning.  States symptoms started at 0800.    AAOx3.  Skin warm and dry.  MAE equally and strong.  Facial movements equal.  Speech clear.  Ambulates with easy and steady gait. NAD

## 2020-05-17 NOTE — ED Notes (Signed)
EDP at bedside  

## 2020-05-17 NOTE — Progress Notes (Signed)
Ch visited with Pt in response to Code Stroke. Ch was with Pt at CT, and then at room. Pt told Ch that she did not want family to be informed about her being here, to not worry them. Pt was brought by her HR representative from work. Pt spoke in length about family and work situation. Pt let Ch know that her 41 yr old daughter is going to be in surgery soon. CH offered pastoral presence and support. Ch will follow-up.

## 2020-05-17 NOTE — ED Notes (Signed)
Pt on phone with MRI for screening questions  

## 2020-05-17 NOTE — ED Notes (Signed)
Pt transported to MRI 

## 2020-05-17 NOTE — H&P (Signed)
History and Physical    Melinda Morris YME:[id number] DOB: [birth year] DOA: 05/17/2020  PCP: Sandrea Hughs, NP   Patient coming from: Home  I have personally briefly reviewed patient's old medical records in [facility]  Chief Complaint: Left sided numbness  HPI: Melinda Morris is a 39 y.o. 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  Patient stated that she did not feel well and had a headache as well as dizziness so she checked her blood pressure and it was elevated with BP 140/110.  Patient states that she has a history of hypertension as well as arrhythmias and was placed on Toprol by cardiologist at [facility].  She has not been taking the Toprol because she said that during her follow-up appointment she was told her blood pressure was low.  She denies having any chest pain, shortness of breath, nausea, vomiting, dizziness or lightheadedness.  She denies having any urinary symptoms, fever, cough, abdominal pain. Upon arrival to the ER she was noted to have an NIHSS of 1 and was not a candidate for TPA. She had a CT scan of the head done without contrast which showed no evidence of acute intracranial hemorrhage or acute demarcated cortical infarction. ASPECTS is 10. She had a CT angiogram of the head and neck which showed no evidence of intracranial large vessel occlusion or proximal high-grade arterial stenosis. She will be referred to observation status for further evaluation  ED Course: 39 year old female who came into the emergency room as a code stroke.  She presented with symptoms of numbness involving the left upper and lower extremity as well as left leg.  Patient not a candidate for TPA  Review of Systems: As per HPI otherwise 10 point review of systems negative.    Past  Medical History:  Diagnosis Date  . Hypertension   . Irregular heart beat     Past Surgical History:  Procedure Laterality Date  . SHOULDER SURGERY Left      reports that she has never smoked. She has never used smokeless tobacco. She reports current alcohol use. No history on file for drug use.  No Known Allergies  History reviewed. No pertinent family history.   Prior to Admission medications   Medication Sig Start Date End Date Taking? Authorizing Provider  LORazepam (ATIVAN) 1 MG tablet Take 1 tablet (1 mg total) by mouth 2 (two) times daily as needed for anxiety. 09/08/19 09/07/20 Yes Emily Filbert, MD  metoprolol succinate (TOPROL-XL) 25 MG 24 hr tablet Take by mouth. Patient not taking: Reported on 05/17/2020 05/16/16   [provider]    Physical Exam: Vitals:   05/17/20 0908 05/17/20 0931 05/17/20 1030  BP: (!) 137/92  124/86  Pulse: 83  75  Resp: 16  18  Temp: 98.8 F (37.1 C)    TempSrc: Oral    SpO2: 100%  100%  Weight:  63.5 kg   Height:  5\' 4"  (1.626 m)      Vitals:   05/17/20 0908 05/17/20 0931 05/17/20 1030  BP: (!) 137/92  124/86  Pulse: 83  75  Resp: 16  18  Temp: 98.8 F (37.1 C)    TempSrc: Oral    SpO2: 100%  100%  Weight:  63.5 kg   Height:  5'  4" (1.626 m)     Constitutional: NAD, alert and oriented x 3 Eyes: PERRL, lids and conjunctivae normal ENMT: Mucous membranes are moist.  Neck: normal, supple, no masses, no thyromegaly Respiratory: clear to auscultation bilaterally, no wheezing, no crackles. Normal respiratory effort. No accessory muscle use.  Cardiovascular: Regular rate and rhythm, no murmurs / rubs / gallops. No extremity edema. 2+ pedal pulses. No carotid bruits.  Abdomen: no tenderness, no masses palpated. No hepatosplenomegaly. Bowel sounds positive.  Musculoskeletal: no clubbing / cyanosis. No joint deformity upper and lower extremities.  Skin: no rashes, lesions, ulcers.  Neurologic: No gross focal  neurologic deficit.  Able to move all extremities Psychiatric: Flat affect.   Labs on Admission: I have personally reviewed following labs and imaging studies  CBC: Recent Labs  Lab 05/17/20 0908  WBC 6.0  NEUTROABS 3.1  HGB 13.4  HCT 38.9  MCV 88.6  PLT 279   Basic Metabolic Panel: Recent Labs  Lab 05/17/20 0908  NA 134*  K 3.7  CL 100  CO2 24  GLUCOSE 94  BUN 10  CREATININE 0.60  CALCIUM 9.2   GFR: Estimated Creatinine Clearance: 80.7 mL/min (by C-G formula based on SCr of 0.6 mg/dL). Liver Function Tests: Recent Labs  Lab 05/17/20 0908  AST 22  ALT 29  ALKPHOS 55  BILITOT 0.7  PROT 7.9  ALBUMIN 4.8   No results for input(s): LIPASE, AMYLASE in the last 168 hours. No results for input(s): AMMONIA in the last 168 hours. Coagulation Profile: Recent Labs  Lab 05/17/20 0908  INR 1.0   Cardiac Enzymes: No results for input(s): CKTOTAL, CKMB, CKMBINDEX, TROPONINI in the last 168 hours. BNP (last 3 results) No results for input(s): PROBNP in the last 8760 hours. HbA1C: No results for input(s): HGBA1C in the last 72 hours. CBG: Recent Labs  Lab 05/17/20 0907  GLUCAP 93   Lipid Profile: No results for input(s): CHOL, HDL, LDLCALC, TRIG, CHOLHDL, LDLDIRECT in the last 72 hours. Thyroid Function Tests: No results for input(s): TSH, T4TOTAL, FREET4, T3FREE, THYROIDAB in the last 72 hours. Anemia Panel: No results for input(s): VITAMINB12, FOLATE, FERRITIN, TIBC, IRON, RETICCTPCT in the last 72 hours. Urine analysis: No results found for: COLORURINE, APPEARANCEUR, LABSPEC, PHURINE, GLUCOSEU, HGBUR, BILIRUBINUR, KETONESUR, PROTEINUR, UROBILINOGEN, NITRITE, LEUKOCYTESUR  Radiological Exams on Admission: CT ANGIO HEAD W OR WO CONTRAST  Result Date: 05/17/2020 CLINICAL DATA:  Stroke code.  Left-sided numbness since 8 a.m. EXAM: CT ANGIOGRAPHY HEAD AND NECK TECHNIQUE: Multidetector CT imaging of the head and neck was performed using the standard protocol  during bolus administration of intravenous contrast. Multiplanar CT image reconstructions and MIPs were obtained to evaluate the vascular anatomy. Carotid stenosis measurements (when applicable) are obtained utilizing NASCET criteria, using the distal internal carotid diameter as the denominator. CONTRAST:  48mL OMNIPAQUE IOHEXOL 350 MG/ML SOLN COMPARISON:  Concurrently performed noncontrast head CT. FINDINGS: CTA NECK FINDINGS Aortic arch: Common origin of the innominate and left common carotid arteries. No hemodynamically significant innominate or proximal subclavian artery stenosis. Right carotid system: CCA and ICA patent within the neck without stenosis. Left carotid system: Streak artifact from a dense left-sided contrast bolus limits evaluation of the origin of the left common carotid artery. Within this limitation, the CCA and ICA are patent within the neck without stenosis. Vertebral arteries: Codominant and patent within the neck without stenosis. Skeleton: No acute bony abnormality or aggressive osseous lesion. Other neck: No neck mass or cervical lymphadenopathy. Thyroid unremarkable. Upper chest:  No consolidation within the imaged lung apices. Review of the MIP images confirms the above findings CTA HEAD FINDINGS Anterior circulation: The intracranial internal carotid arteries are patent without significant stenosis. The M1 middle cerebral arteries are patent without significant stenosis. No M2 proximal branch occlusion or high-grade proximal stenosis is identified. The anterior cerebral arteries are patent bilaterally without significant proximal stenosis. No intracranial aneurysm is identified. Posterior circulation: The intracranial vertebral arteries are patent without significant stenosis, as is the basilar artery. The posterior cerebral arteries are patent bilaterally without significant proximal stenosis. Hypoplastic right P1 segment with a sizable right posterior communicating artery. The left  posterior communicating artery is hypoplastic or absent. Venous sinuses: Within limitations of contrast timing, no convincing thrombus. Anatomic variants: As described Review of the MIP images confirms the above findings These results were called by telephone at the time of interpretation on 05/17/2020 at 9:42 am to provider Dr. Cherylann Banas, who verbally acknowledged these results. IMPRESSION: CTA neck: Streak artifact from a dense left-sided contrast bolus limits evaluation of the origin of the left CCA. Within this limitation, the common carotid, internal carotid and vertebral arteries are patent within the neck without hemodynamically significant stenosis. CTA head: No evidence of intracranial large vessel occlusion or proximal high-grade arterial stenosis. Electronically Signed   By: Kellie Simmering DO   On: 05/17/2020 09:42   CT ANGIO NECK W OR WO CONTRAST  Result Date: 05/17/2020 CLINICAL DATA:  Stroke code.  Left-sided numbness since 8 a.m. EXAM: CT ANGIOGRAPHY HEAD AND NECK TECHNIQUE: Multidetector CT imaging of the head and neck was performed using the standard protocol during bolus administration of intravenous contrast. Multiplanar CT image reconstructions and MIPs were obtained to evaluate the vascular anatomy. Carotid stenosis measurements (when applicable) are obtained utilizing NASCET criteria, using the distal internal carotid diameter as the denominator. CONTRAST:  31mL OMNIPAQUE IOHEXOL 350 MG/ML SOLN COMPARISON:  Concurrently performed noncontrast head CT. FINDINGS: CTA NECK FINDINGS Aortic arch: Common origin of the innominate and left common carotid arteries. No hemodynamically significant innominate or proximal subclavian artery stenosis. Right carotid system: CCA and ICA patent within the neck without stenosis. Left carotid system: Streak artifact from a dense left-sided contrast bolus limits evaluation of the origin of the left common carotid artery. Within this limitation, the CCA and ICA are  patent within the neck without stenosis. Vertebral arteries: Codominant and patent within the neck without stenosis. Skeleton: No acute bony abnormality or aggressive osseous lesion. Other neck: No neck mass or cervical lymphadenopathy. Thyroid unremarkable. Upper chest: No consolidation within the imaged lung apices. Review of the MIP images confirms the above findings CTA HEAD FINDINGS Anterior circulation: The intracranial internal carotid arteries are patent without significant stenosis. The M1 middle cerebral arteries are patent without significant stenosis. No M2 proximal branch occlusion or high-grade proximal stenosis is identified. The anterior cerebral arteries are patent bilaterally without significant proximal stenosis. No intracranial aneurysm is identified. Posterior circulation: The intracranial vertebral arteries are patent without significant stenosis, as is the basilar artery. The posterior cerebral arteries are patent bilaterally without significant proximal stenosis. Hypoplastic right P1 segment with a sizable right posterior communicating artery. The left posterior communicating artery is hypoplastic or absent. Venous sinuses: Within limitations of contrast timing, no convincing thrombus. Anatomic variants: As described Review of the MIP images confirms the above findings These results were called by telephone at the time of interpretation on 05/17/2020 at 9:42 am to provider Dr. Cherylann Banas, who verbally acknowledged these results. IMPRESSION: CTA  neck: Streak artifact from a dense left-sided contrast bolus limits evaluation of the origin of the left CCA. Within this limitation, the common carotid, internal carotid and vertebral arteries are patent within the neck without hemodynamically significant stenosis. CTA head: No evidence of intracranial large vessel occlusion or proximal high-grade arterial stenosis. Electronically Signed   By: Jackey Loge DO   On: 05/17/2020 09:42   CT HEAD CODE  STROKE WO CONTRAST  Result Date: 05/17/2020 CLINICAL DATA:  Code stroke. Left-sided numbness beginning at 8 a.m. Possible stroke; neuro deficit, acute, stroke suspected. EXAM: CT HEAD WITHOUT CONTRAST TECHNIQUE: Contiguous axial images were obtained from the base of the skull through the vertex without intravenous contrast. COMPARISON:  Head CT 09/08/2019. FINDINGS: Brain: Cerebral volume is normal for age. There is no acute intracranial hemorrhage. No demarcated cortical infarct is identified. No extra-axial fluid collection. No evidence of intracranial mass. No midline shift. Vascular: No hyperdense vessel. Skull: Normal. Negative for fracture or focal lesion. Sinuses/Orbits: Visualized orbits show no acute finding. No significant paranasal sinus disease or mastoid effusion at the imaged levels. ASPECTS ([place]berta Stroke Program Early CT Score) - Ganglionic level infarction (caudate, lentiform nuclei, internal capsule, insula, M1-M3 cortex): 7 - Supraganglionic infarction (M4-M6 cortex): 3 Total score (0-10 with 10 being normal): 10 These results were called by telephone at the time of interpretation on 05/17/2020 at 9:28 am to provider Dr. Marisa Severin, who verbally acknowledged these results. IMPRESSION: No evidence of acute intracranial hemorrhage or acute demarcated cortical infarction. ASPECTS is 10. Electronically Signed   By: Jackey Loge DO   On: 05/17/2020 09:28    EKG: Independently reviewed.  Sinus rhythm  Assessment/Plan Principal Problem:   TIA (transient ischemic attack) Active Problems:   Hypertension     TIA Patient with a known history of hypertension not on any medication who presents to the ER for evaluation of numbness involving the left arm and leg as well as left lower lip. CT scan of the head without contrast is negative for bleed CTA does not show any large vessel occlusion We will obtain MRI of the brain without contrast to rule out an acute stroke Obtain 2D echocardiogram to  rule out cardiac thrombus We will request physical therapy/speech therapy/occupational therapy consult Place patient on ASA 325mg  daily as well as high intensity statin Will request neurology consult   Hypertension We will allow for permissive hypertension until an acute stroke is ruled out Patient is supposed to be on Toprol-XL 50 mg daily    DVT prophylaxis: SCD Code Status: Full code Family Communication: Greater than 50% of time was spent discussing plan of care with patient at the bedside.  All questions and concerns have been addressed.  She verbalizes understanding and agrees with the plan Disposition Plan: Back to previous home environment Consults called: Neurology    MD [facility]     05/17/2020, 11:33 AM

## 2020-05-18 DIAGNOSIS — E785 Hyperlipidemia, unspecified: Secondary | ICD-10-CM

## 2020-05-18 DIAGNOSIS — G459 Transient cerebral ischemic attack, unspecified: Principal | ICD-10-CM

## 2020-05-18 DIAGNOSIS — F419 Anxiety disorder, unspecified: Secondary | ICD-10-CM

## 2020-05-18 DIAGNOSIS — R200 Anesthesia of skin: Secondary | ICD-10-CM

## 2020-05-18 LAB — HEMOGLOBIN A1C
Hgb A1c MFr Bld: 5.4 % (ref 4.8–5.6)
Mean Plasma Glucose: 108.28 mg/dL

## 2020-05-18 LAB — LIPID PANEL
Cholesterol: 237 mg/dL — ABNORMAL HIGH (ref 0–200)
HDL: 83 mg/dL (ref >40)
LDL Cholesterol: 137 mg/dL — ABNORMAL HIGH (ref 0–99)
Total CHOL/HDL Ratio: 2.9 RATIO
Triglycerides: 85 mg/dL (ref <150)
VLDL: 17 mg/dL (ref 0–40)

## 2020-05-18 LAB — ANTITHROMBIN III: AntiThromb III Func: 107 % (ref 75–120)

## 2020-05-18 LAB — HIV ANTIBODY (ROUTINE TESTING W REFLEX): HIV Screen 4th Generation wRfx: NONREACTIVE

## 2020-05-18 MED ORDER — ASPIRIN EC 81 MG PO TBEC
81.0000 mg | DELAYED_RELEASE_TABLET | Freq: Every day | ORAL | 0 refills | Status: DC
Start: 2020-05-18 — End: 2020-12-28

## 2020-05-18 MED ORDER — ATORVASTATIN CALCIUM 80 MG PO TABS: 80.0000 mg | ORAL_TABLET | Freq: Every day | ORAL | 0 refills | Status: DC

## 2020-05-18 NOTE — Progress Notes (Signed)
Subjective: Patient currently at baseline.  Reports one episode this morning of left face and arm numbness that lasted a few minutes and resolved spontaneously.    Objective: Current vital signs: BP 124/86 (BP Location: Right Arm)   Pulse 66   Temp 98.7 F (37.1 C) (Oral)   Resp 17   Ht 5\' 4"  (1.626 m)   Wt 68.9 kg   LMP 04/17/2020   SpO2 100%   BMI 26.07 kg/m  Vital signs in last 24 hours: Temp:  [98 F (36.7 C)-98.7 F (37.1 C)] 98.7 F (37.1 C) (06/25 0810) Pulse Rate:  [66-86] 66 (06/25 0810) Resp:  [16-26] 17 (06/25 0810) BP: (122-132)/(83-90) 124/86 (06/25 0810) SpO2:  [99 %-100 %] 100 % (06/25 0810) Weight:  [68.9 kg] 68.9 kg (06/24 2025)  Intake/Output from previous day: No intake/output data recorded. Intake/Output this shift: No intake/output data recorded. Nutritional status:  Diet Order            Diet 2 gram sodium Room service appropriate? Yes; Fluid consistency: Thin  Diet effective now                 Neurologic Exam: Mental Status: Alert, oriented, thought content appropriate.  Speech fluent without evidence of aphasia.  Able to follow 3 step commands without difficulty. Cranial Nerves: II: Discs flat bilaterally; Visual fields grossly normal, pupils equal, round, reactive to light and accommodation III,IV, VI: ptosis not present, extra-ocular motions intact bilaterally V,VII: smile symmetric, facial light touch sensation normal bilaterally VIII: hearing normal bilaterally IX,X: gag reflex present XI: bilateral shoulder shrug XII: midline tongue extension Motor: 5/5 throughout Sensory: Pinprick and light touch intact throughout, bilaterally   Lab Results: Basic Metabolic Panel: Recent Labs  Lab 05/17/20 0908  NA 134*  K 3.7  CL 100  CO2 24  GLUCOSE 94  BUN 10  CREATININE 0.60  CALCIUM 9.2    Liver Function Tests: Recent Labs  Lab 05/17/20 0908  AST 22  ALT 29  ALKPHOS 55  BILITOT 0.7  PROT 7.9  ALBUMIN 4.8   No  results for input(s): LIPASE, AMYLASE in the last 168 hours. No results for input(s): AMMONIA in the last 168 hours.  CBC: Recent Labs  Lab 05/17/20 0908  WBC 6.0  NEUTROABS 3.1  HGB 13.4  HCT 38.9  MCV 88.6  PLT 279    Cardiac Enzymes: No results for input(s): CKTOTAL, CKMB, CKMBINDEX, TROPONINI in the last 168 hours.  Lipid Panel: Recent Labs  Lab 05/18/20 0445  CHOL 237*  TRIG 85  HDL 83  CHOLHDL 2.9  VLDL 17  LDLCALC 05/20/20*    CBG: Recent Labs  Lab 05/17/20 0907  GLUCAP 93    Microbiology: Results for orders placed or performed during the hospital encounter of 05/17/20  SARS Coronavirus 2 by RT PCR (hospital order, performed in [facility] hospital lab) Nasopharyngeal Nasopharyngeal Swab     Status: None   Collection Time: 05/17/20 10:17 AM   Specimen: Nasopharyngeal Swab  Result Value Ref Range Status   SARS Coronavirus 2 NEGATIVE NEGATIVE Final    Comment: (NOTE) SARS-CoV-2 target nucleic acids are NOT DETECTED.  The SARS-CoV-2 RNA is generally detectable in upper and lower respiratory specimens during the acute phase of infection. The lowest concentration of SARS-CoV-2 viral copies this assay can detect is 250 copies / mL. A negative result does not preclude SARS-CoV-2 infection and should not be used as the sole basis for treatment or other patient management decisions.  A negative result may occur with improper specimen collection / handling, submission of specimen other than nasopharyngeal swab, presence of viral mutation(s) within the areas targeted by this assay, and inadequate number of viral copies (<250 copies / mL). A negative result must be combined with clinical observations, patient history, and epidemiological information.  Fact Sheet for Patients:   BoilerBrush.com.cyhttps://www.fda.gov/media/136312/download  Fact Sheet for Healthcare Providers: https://pope.com/https://www.fda.gov/media/136313/download  This test is not yet approved or  cleared by the [place]  FDA and has been authorized for detection and/or diagnosis of SARS-CoV-2 by FDA under an Emergency Use Authorization (EUA).  This EUA will remain in effect (meaning this test can be used) for the duration of the COVID-19 declaration under Section 564(b)(1) of the Act, 21 U.S.C. section 360bbb-3(b)(1), unless the authorization is terminated or revoked sooner.  Performed at [facility], [address] [place], [place] [id number]     Coagulation Studies: Recent Labs    05/17/20 0908  LABPROT 12.4  INR 1.0    Imaging: CT ANGIO HEAD W OR WO CONTRAST  Result Date: 05/17/2020 CLINICAL DATA:  Stroke code.  Left-sided numbness since 8 a.m. EXAM: CT ANGIOGRAPHY HEAD AND NECK TECHNIQUE: Multidetector CT imaging of the head and neck was performed using the standard protocol during bolus administration of intravenous contrast. Multiplanar CT image reconstructions and MIPs were obtained to evaluate the vascular anatomy. Carotid stenosis measurements (when applicable) are obtained utilizing NASCET criteria, using the distal internal carotid diameter as the denominator. CONTRAST:  75mL OMNIPAQUE IOHEXOL 350 MG/ML SOLN COMPARISON:  Concurrently performed noncontrast head CT. FINDINGS: CTA NECK FINDINGS Aortic arch: Common origin of the innominate and left common carotid arteries. No hemodynamically significant innominate or proximal subclavian artery stenosis. Right carotid system: CCA and ICA patent within the neck without stenosis. Left carotid system: Streak artifact from a dense left-sided contrast bolus limits evaluation of the origin of the left common carotid artery. Within this limitation, the CCA and ICA are patent within the neck without stenosis. Vertebral arteries: Codominant and patent within the neck without stenosis. Skeleton: No acute bony abnormality or aggressive osseous lesion. Other neck: No neck mass or cervical lymphadenopathy. Thyroid unremarkable. Upper chest: No  consolidation within the imaged lung apices. Review of the MIP images confirms the above findings CTA HEAD FINDINGS Anterior circulation: The intracranial internal carotid arteries are patent without significant stenosis. The M1 middle cerebral arteries are patent without significant stenosis. No M2 proximal branch occlusion or high-grade proximal stenosis is identified. The anterior cerebral arteries are patent bilaterally without significant proximal stenosis. No intracranial aneurysm is identified. Posterior circulation: The intracranial vertebral arteries are patent without significant stenosis, as is the basilar artery. The posterior cerebral arteries are patent bilaterally without significant proximal stenosis. Hypoplastic right P1 segment with a sizable right posterior communicating artery. The left posterior communicating artery is hypoplastic or absent. Venous sinuses: Within limitations of contrast timing, no convincing thrombus. Anatomic variants: As described Review of the MIP images confirms the above findings These results were called by telephone at the time of interpretation on 05/17/2020 at 9:42 am to provider Dr. Marisa SeverinSiadecki, who verbally acknowledged these results. IMPRESSION: CTA neck: Streak artifact from a dense left-sided contrast bolus limits evaluation of the origin of the left CCA. Within this limitation, the common carotid, internal carotid and vertebral arteries are patent within the neck without hemodynamically significant stenosis. CTA head: No evidence of intracranial large vessel occlusion or proximal high-grade arterial stenosis. Electronically Signed   By: Ronaldo MiyamotoKyle  Golden DO   On: 05/17/2020 09:42   CT ANGIO NECK W OR WO CONTRAST  Result Date: 05/17/2020 CLINICAL DATA:  Stroke code.  Left-sided numbness since 8 a.m. EXAM: CT ANGIOGRAPHY HEAD AND NECK TECHNIQUE: Multidetector CT imaging of the head and neck was performed using the standard protocol during bolus administration of  intravenous contrast. Multiplanar CT image reconstructions and MIPs were obtained to evaluate the vascular anatomy. Carotid stenosis measurements (when applicable) are obtained utilizing NASCET criteria, using the distal internal carotid diameter as the denominator. CONTRAST:  73mL OMNIPAQUE IOHEXOL 350 MG/ML SOLN COMPARISON:  Concurrently performed noncontrast head CT. FINDINGS: CTA NECK FINDINGS Aortic arch: Common origin of the innominate and left common carotid arteries. No hemodynamically significant innominate or proximal subclavian artery stenosis. Right carotid system: CCA and ICA patent within the neck without stenosis. Left carotid system: Streak artifact from a dense left-sided contrast bolus limits evaluation of the origin of the left common carotid artery. Within this limitation, the CCA and ICA are patent within the neck without stenosis. Vertebral arteries: Codominant and patent within the neck without stenosis. Skeleton: No acute bony abnormality or aggressive osseous lesion. Other neck: No neck mass or cervical lymphadenopathy. Thyroid unremarkable. Upper chest: No consolidation within the imaged lung apices. Review of the MIP images confirms the above findings CTA HEAD FINDINGS Anterior circulation: The intracranial internal carotid arteries are patent without significant stenosis. The M1 middle cerebral arteries are patent without significant stenosis. No M2 proximal branch occlusion or high-grade proximal stenosis is identified. The anterior cerebral arteries are patent bilaterally without significant proximal stenosis. No intracranial aneurysm is identified. Posterior circulation: The intracranial vertebral arteries are patent without significant stenosis, as is the basilar artery. The posterior cerebral arteries are patent bilaterally without significant proximal stenosis. Hypoplastic right P1 segment with a sizable right posterior communicating artery. The left posterior communicating artery  is hypoplastic or absent. Venous sinuses: Within limitations of contrast timing, no convincing thrombus. Anatomic variants: As described Review of the MIP images confirms the above findings These results were called by telephone at the time of interpretation on 05/17/2020 at 9:42 am to provider Dr. Marisa Severin, who verbally acknowledged these results. IMPRESSION: CTA neck: Streak artifact from a dense left-sided contrast bolus limits evaluation of the origin of the left CCA. Within this limitation, the common carotid, internal carotid and vertebral arteries are patent within the neck without hemodynamically significant stenosis. CTA head: No evidence of intracranial large vessel occlusion or proximal high-grade arterial stenosis. Electronically Signed   By: Jackey Loge DO   On: 05/17/2020 09:42   MR BRAIN WO CONTRAST  Result Date: 05/17/2020 CLINICAL DATA:  Stroke suspected. EXAM: MRI HEAD WITHOUT CONTRAST TECHNIQUE: Multiplanar, multiecho pulse sequences of the brain and surrounding structures were obtained without intravenous contrast. COMPARISON:  Head CT May 17, 2020 FINDINGS: Brain: No acute infarction, hemorrhage, hydrocephalus, extra-axial collection or mass lesion. The brain parenchyma has normal morphology and signal characteristics. Vascular: Normal flow voids. Skull and upper cervical spine: Normal marrow signal. Sinuses/Orbits: Negative. Other: None. IMPRESSION: Unremarkable MRI of the brain. Electronically Signed   By: Baldemar Lenis M.D.   On: 05/17/2020 15:04   ECHOCARDIOGRAM COMPLETE  Result Date: 05/17/2020    ECHOCARDIOGRAM REPORT   Patient Name:   Melinda Morris Date of Exam: 05/17/2020 Medical Rec #:  [id number]            Height:       64.0 in Accession #:  [id number]           Weight:       140.0 lb Date of Birth:  17-Dec-1980            BSA:          1.681 m Patient Age:    76 years             BP:           132/83 mmHg Patient Gender: F                    HR:            73 bpm. Exam Location:  [facility] Procedure: 2D Echo, Cardiac Doppler and Color Doppler Indications:     Stroke 434.91  History:         Patient has no prior history of Echocardiogram examinations.                  Risk Factors:Hypertension. Irregular heart beat.  Sonographer:     Sherrie Sport RDCS (AE) Referring Phys:  [id number] Collier Bullock Diagnosing Phys: Kathlyn Sacramento MD  Sonographer Comments: Suboptimal apical window. IMPRESSIONS  1. Left ventricular ejection fraction, by estimation, is 60 to 65%. The left ventricle has normal function. The left ventricle has no regional wall motion abnormalities. Left ventricular diastolic parameters were normal.  2. Right ventricular systolic function is normal. The right ventricular size is normal. Tricuspid regurgitation signal is inadequate for assessing PA pressure.  3. The mitral valve is normal in structure. No evidence of mitral valve regurgitation. No evidence of mitral stenosis.  4. The aortic valve is normal in structure. Aortic valve regurgitation is not visualized. No aortic stenosis is present.  5. The inferior vena cava is normal in size with greater than 50% respiratory variability, suggesting right atrial pressure of 3 mmHg. FINDINGS  Left Ventricle: Left ventricular ejection fraction, by estimation, is 60 to 65%. The left ventricle has normal function. The left ventricle has no regional wall motion abnormalities. The left ventricular internal cavity size was normal in size. There is  borderline left ventricular hypertrophy. Left ventricular diastolic parameters were normal. Right Ventricle: The right ventricular size is normal. No increase in right ventricular wall thickness. Right ventricular systolic function is normal. Tricuspid regurgitation signal is inadequate for assessing PA pressure. The tricuspid regurgitant velocity is 2.12 m/s, and with an assumed right atrial pressure of 10 mmHg, the estimated right ventricular systolic pressure is 66.0 mmHg. Left  Atrium: Left atrial size was normal in size. Right Atrium: Right atrial size was normal in size. Pericardium: There is no evidence of pericardial effusion. Mitral Valve: The mitral valve is normal in structure. Normal mobility of the mitral valve leaflets. No evidence of mitral valve regurgitation. No evidence of mitral valve stenosis. Tricuspid Valve: The tricuspid valve is normal in structure. Tricuspid valve regurgitation is not demonstrated. No evidence of tricuspid stenosis. Aortic Valve: The aortic valve is normal in structure. Aortic valve regurgitation is not visualized. No aortic stenosis is present. Aortic valve mean gradient measures 3.7 mmHg. Aortic valve peak gradient measures 6.7 mmHg. Aortic valve area, by VTI measures 2.10 cm. Pulmonic Valve: The pulmonic valve was normal in structure. Pulmonic valve regurgitation is not visualized. No evidence of pulmonic stenosis. Aorta: The aortic root is normal in size and structure. Venous: The inferior vena cava is normal in size with greater than 50% respiratory variability, suggesting right atrial pressure of 3 mmHg. IAS/Shunts:  No atrial level shunt detected by color flow Doppler.  LEFT VENTRICLE PLAX 2D LVIDd:         3.34 cm  Diastology LVIDs:         1.94 cm  LV e' lateral:   15.20 cm/s LV PW:         1.04 cm  LV E/e' lateral: 5.4 LV IVS:        1.15 cm  LV e' medial:    6.96 cm/s LVOT diam:     2.00 cm  LV E/e' medial:  11.8 LV SV:         53 LV SV Index:   32 LVOT Area:     3.14 cm  RIGHT VENTRICLE RV Basal diam:  3.43 cm RV S prime:     13.20 cm/s TAPSE (M-mode): 3.9 cm LEFT ATRIUM             Index       RIGHT ATRIUM          Index LA diam:        2.60 cm 1.55 cm/m  RA Area:     9.93 cm LA Vol (A2C):   25.8 ml 15.35 ml/m RA Volume:   17.80 ml 10.59 ml/m LA Vol (A4C):   26.6 ml 15.82 ml/m LA Biplane Vol: 27.9 ml 16.60 ml/m  AORTIC VALVE                   PULMONIC VALVE AV Area (Vmax):    1.97 cm    RVOT Peak grad: 3 mmHg AV Area (Vmean):    2.03 cm AV Area (VTI):     2.10 cm AV Vmax:           129.67 cm/s AV Vmean:          88.000 cm/s AV VTI:            0.254 m AV Peak Grad:      6.7 mmHg AV Mean Grad:      3.7 mmHg LVOT Vmax:         81.30 cm/s LVOT Vmean:        56.800 cm/s LVOT VTI:          0.170 m LVOT/AV VTI ratio: 0.67  AORTA Ao Root diam: 2.40 cm MITRAL VALVE               TRICUSPID VALVE MV Area (PHT): 3.27 cm    TR Peak grad:   18.0 mmHg MV Decel Time: 232 msec    TR Vmax:        212.00 cm/s MV E velocity: 82.00 cm/s MV A velocity: 56.80 cm/s  SHUNTS MV E/A ratio:  1.44        Systemic VTI:  0.17 m                            Systemic Diam: 2.00 cm Lorine Bears MD Electronically signed by Lorine Bears MD Signature Date/Time: 05/17/2020/5:00:04 PM    Final    CT HEAD CODE STROKE WO CONTRAST  Result Date: 05/17/2020 CLINICAL DATA:  Code stroke. Left-sided numbness beginning at 8 a.m. Possible stroke; neuro deficit, acute, stroke suspected. EXAM: CT HEAD WITHOUT CONTRAST TECHNIQUE: Contiguous axial images were obtained from the base of the skull through the vertex without intravenous contrast. COMPARISON:  Head CT 09/08/2019. FINDINGS: Brain: Cerebral volume is normal for age. There is no acute intracranial hemorrhage. No  demarcated cortical infarct is identified. No extra-axial fluid collection. No evidence of intracranial mass. No midline shift. Vascular: No hyperdense vessel. Skull: Normal. Negative for fracture or focal lesion. Sinuses/Orbits: Visualized orbits show no acute finding. No significant paranasal sinus disease or mastoid effusion at the imaged levels. ASPECTS (Alberta Stroke Program Early CT Score) - Ganglionic level infarction (caudate, lentiform nuclei, internal capsule, insula, M1-M3 cortex): 7 - Supraganglionic infarction (M4-M6 cortex): 3 Total score (0-10 with 10 being normal): 10 These results were called by telephone at the time of interpretation on 05/17/2020 at 9:28 am to provider Dr. Marisa Severin, who verbally  acknowledged these results. IMPRESSION: No evidence of acute intracranial hemorrhage or acute demarcated cortical infarction. ASPECTS is 10. Electronically Signed   By: Jackey Loge DO   On: 05/17/2020 09:28    Medications:  I have reviewed the patient's current medications. Scheduled: . aspirin  300 mg Rectal Daily   Or  . aspirin EC  325 mg Oral Daily  . atorvastatin  80 mg Oral Daily  . sodium chloride flush  3 mL Intravenous Once    Assessment/Plan: 39 y.o. female with a history of HTN and irregulat heart beat who presents with acute onset left sided numbness and headache.  Symptoms now resolved.  MRI of the brain personally reviewed and shows no acute infarct.  CTA shows no evidence of hemodynamically significant stenosis.  Echocardiogram shows no cardiac source of emboli with an EF of 60-65%.  A1c 5.4, LDL 137.  Hypercoag panel is pending.   Although risk factors are minimal, TIA remains on the differential.    Recommendations: 1. ASA  daily 2. Statin for lipid management with target LDL<70. 3. Patient to follow up with neurology on an outpatient basis    LOS: 0 days   Thana Farr, MD Neurology [phone] 05/18/2020  10:27 AM

## 2020-05-18 NOTE — Evaluation (Signed)
Physical Therapy Evaluation Patient Details Name: Melinda Morris MRN: [id number] DOB: 1980/11/29 Today's Date: 05/18/2020   History of Present Illness  Pt is a 39 y.o. 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    Clinical Impression  Pt pleasant and motivated to participate during the session.  Pt's BLE and BUE strength, coordination, and sensation to light touch and proprioception all WNL.  No visual deficits noted with peripheral vision, VORs, and tracking all WNL.  Pt presented with good balance including easily maintaining SLS > 10 sec bilaterally and reported no adverse symptoms during the session.  No skilled PT needs identified at this time.  Will complete PT orders at this time but will reassess pt pending a change in status upon receipt of new PT orders.      Follow Up Recommendations No PT follow up    Equipment Recommendations  None recommended by PT    Recommendations for Other Services       Precautions / Restrictions Precautions Precautions: None Restrictions Weight Bearing Restrictions: No      Mobility  Bed Mobility Overal bed mobility: Independent                Transfers Overall transfer level: Independent Equipment used: None             General transfer comment: Very good eccentric and concentric control and stability  Ambulation/Gait Ambulation/Gait assistance: Independent Gait Distance (Feet): 200 Feet Assistive device: None Gait Pattern/deviations: WFL(Within Functional Limits) Gait velocity: WNL   General Gait Details: Good stability during quick 180 deg turns, start/stops, and with head turns  Financial trader Rankin (Stroke Patients Only)       Balance Overall balance assessment:  Independent                                           Pertinent Vitals/Pain Pain Assessment: No/denies pain    Home Living Family/patient expects to be discharged to:: Private residence Living Arrangements: Spouse/significant other;Children Available Help at Discharge: Family;Available PRN/intermittently Type of Home: House Home Access: Level entry     Home Layout: One level Home Equipment: Shower seat - built in      Prior Function Level of Independence: Independent         Comments: Pt was independnet in all I/ADLs including working full time (on feet t/o day for work)     Journalist, newspaper        Extremity/Trunk Assessment   Upper Extremity Assessment Upper Extremity Assessment: RUE deficits/detail;LUE deficits/detail RUE Deficits / Details: RUE strength 5/5 throughout RUE Sensation: WNL RUE Coordination: WNL LUE Deficits / Details: LUE strength 5/5 throughout LUE Sensation: WNL LUE Coordination: WNL    Lower Extremity Assessment Lower Extremity Assessment: RLE deficits/detail;LLE deficits/detail RLE Deficits / Details: RLE strength 5/5 throughout RLE Sensation: WNL RLE Coordination: WNL LLE Deficits / Details: LLE strength 5/5 throughout LLE Sensation: WNL LLE Coordination: WNL       Communication   Communication: No difficulties  Cognition Arousal/Alertness: Awake/alert Behavior During Therapy: WFL for tasks assessed/performed Overall Cognitive Status: Within Functional Limits for  tasks assessed                                        General Comments General comments (skin integrity, edema, etc.): SLS time >10 sec to BLEs    Exercises Other Exercises Other Exercises: Pt educated re: OT role, d/c recs, DME recs, falls prevention, ECS Other Exercises: LBD, meal setup, sup>sit, sit<>stand, in room ~ 15 ft functional mobility, functional reach inside/outside BOS, sitting/standing balance/tolerance   Assessment/Plan     PT Assessment Patent does not need any further PT services  PT Problem List         PT Treatment Interventions      PT Goals (Current goals can be found in the Care Plan section)  Acute Rehab PT Goals Patient Stated Goal: To return to work  PT Goal Formulation: All assessment and education complete, DC therapy    Frequency     Barriers to discharge        Co-evaluation               AM-PAC PT "6 Clicks" Mobility  Outcome Measure Help needed turning from your back to your side while in a flat bed without using bedrails?: None Help needed moving from lying on your back to sitting on the side of a flat bed without using bedrails?: None Help needed moving to and from a bed to a chair (including a wheelchair)?: None Help needed standing up from a chair using your arms (e.g., wheelchair or bedside chair)?: None Help needed to walk in hospital room?: None Help needed climbing 3-5 steps with a railing? : None 6 Click Score: 24    End of Session Equipment Utilized During Treatment: Gait belt Activity Tolerance: Patient tolerated treatment well Patient left: in chair;with call bell/phone within reach Nurse Communication: Mobility status PT Visit Diagnosis: Other symptoms and signs involving the nervous system (R29.898)    Time: 7858-8502 PT Time Calculation (min) (ACUTE ONLY): 23 min   Charges:   PT Evaluation $PT Eval Low Complexity: 1 Low          D. Elly Modena PT, DPT 05/18/20, 10:44 AM

## 2020-05-18 NOTE — Discharge Summary (Signed)
[facility] NAME: Melinda Morris    MR#:  [id number]  DATE OF BIRTH:  October 09, 1981  DATE OF ADMISSION:  05/17/2020 ADMITTING PHYSICIAN: Collier Bullock, MD  DATE OF DISCHARGE: 05/18/2020 12:18 PM  PRIMARY CARE PHYSICIAN: Freddy Finner, NP    ADMISSION DIAGNOSIS:  TIA (transient ischemic attack) [G45.9] Numbness [R20.0]  DISCHARGE DIAGNOSIS:  Principal Problem:   TIA (transient ischemic attack) Active Problems:   Hypertension   SECONDARY DIAGNOSIS:   Past Medical History:  Diagnosis Date   Hypertension    Irregular heart beat     HOSPITAL COURSE:   1.  Transient ischemic attack.  Numbness left side of the body, headache, blurry vision, dizziness and palpitations.  Symptoms have resolved..  Will treat for TIA with aspirin and Lipitor.  Patient seen by neurology.  MRI of the brain was negative.  CT angio of the head and neck was negative.  Echocardiogram negative.  Will need referral from PMD to get to neurology as outpatient 2.  Hyperlipidemia.  LDL 137 goal less than 70 3.  Anxiety on Ativan at home.  Follow-up with PMD.  Patient denies anxiety or stressors.  Can consider daily medication to prevent anxiety as outpatient.  The patient's symptoms could go along with panic attack.  DISCHARGE CONDITIONS:   Satisfactory  CONSULTS OBTAINED:  Treatment Team:  Catarina Hartshorn, MD  DRUG ALLERGIES:  No Known Allergies  DISCHARGE MEDICATIONS:   Allergies as of 05/18/2020   No Known Allergies     Medication List    STOP taking these medications   metoprolol succinate 25 MG 24 hr tablet Commonly known as: TOPROL-XL     TAKE these medications   aspirin EC 81 MG tablet Take 1 tablet (81 mg total) by mouth daily. Swallow whole.   atorvastatin 80 MG tablet Commonly known as: LIPITOR Take 1 tablet (80 mg total) by mouth daily.   LORazepam 1 MG tablet Commonly known as: Ativan Take 1 tablet (1 mg total) by  mouth 2 (two) times daily as needed for anxiety.        DISCHARGE INSTRUCTIONS:   Follow-up with PMD  If you experience worsening of your admission symptoms, develop shortness of breath, life threatening emergency, suicidal or homicidal thoughts you must seek medical attention immediately by calling 911 or calling your MD immediately  if symptoms less severe.  You Must read complete instructions/literature along with all the possible adverse reactions/side effects for all the Medicines you take and that have been prescribed to you. Take any new Medicines after you have completely understood and accept all the possible adverse reactions/side effects.   Please note  You were cared for by a hospitalist during your hospital stay. If you have any questions about your discharge medications or the care you received while you were in the hospital after you are discharged, you can call the unit and asked to speak with the hospitalist on call if the hospitalist that took care of you is not available. Once you are discharged, your primary care physician will handle any further medical issues. Please note that NO REFILLS for any discharge medications will be authorized once you are discharged, as it is imperative that you return to your primary care physician (or establish a relationship with a primary care physician if you do not have one) for your aftercare needs so that they can reassess your need for medications and monitor your lab values.  Today   CHIEF COMPLAINT:   Chief Complaint  Patient presents with   Numbness    HISTORY OF PRESENT ILLNESS:  Melinda Morris  is a 39 y.o. female coming in with numbness of the left side of her face and body.   VITAL SIGNS:  Blood pressure 124/86, pulse 66, temperature 98.7 F (37.1 C), temperature source Oral, resp. rate 17, height 5\' 4"  (1.626 m), weight 68.9 kg, last menstrual period 04/17/2020, SpO2 100 %.  PHYSICAL EXAMINATION:   GENERAL:  39 y.o.-year-old patient lying in the bed with no acute distress.  EYES: Pupils equal, round, reactive to light and accommodation. No scleral icterus. HEENT: Head atraumatic, normocephalic. Oropharynx and nasopharynx clear.  LUNGS: Normal breath sounds bilaterally, no wheezing, rales,rhonchi or crepitation. No use of accessory muscles of respiration.  CARDIOVASCULAR: S1, S2 normal. No murmurs, rubs, or gallops.  ABDOMEN: Soft, non-tender, non-distended. EXTREMITIES: No pedal edema.  NEUROLOGIC: Cranial nerves II through XII are intact. Muscle strength 5/5 in all extremities. Sensation intact. Gait not checked.  PSYCHIATRIC: The patient is alert and oriented x 3.  SKIN: No obvious rash, lesion, or ulcer.   DATA REVIEW:   CBC Recent Labs  Lab 05/17/20 0908  WBC 6.0  HGB 13.4  HCT 38.9  PLT 279    Chemistries  Recent Labs  Lab 05/17/20 0908  NA 134*  K 3.7  CL 100  CO2 24  GLUCOSE 94  BUN 10  CREATININE 0.60  CALCIUM 9.2  AST 22  ALT 29  ALKPHOS 55  BILITOT 0.7    Microbiology Results  Results for orders placed or performed during the hospital encounter of 05/17/20  SARS Coronavirus 2 by RT PCR (hospital order, performed in [facility] hospital lab) Nasopharyngeal Nasopharyngeal Swab     Status: None   Collection Time: 05/17/20 10:17 AM   Specimen: Nasopharyngeal Swab  Result Value Ref Range Status   SARS Coronavirus 2 NEGATIVE NEGATIVE Final    Comment: (NOTE) SARS-CoV-2 target nucleic acids are NOT DETECTED.  The SARS-CoV-2 RNA is generally detectable in upper and lower respiratory specimens during the acute phase of infection. The lowest concentration of SARS-CoV-2 viral copies this assay can detect is 250 copies / mL. A negative result does not preclude SARS-CoV-2 infection and should not be used as the sole basis for treatment or other patient management decisions.  A negative result may occur with improper specimen collection / handling,  submission of specimen other than nasopharyngeal swab, presence of viral mutation(s) within the areas targeted by this assay, and inadequate number of viral copies (<250 copies / mL). A negative result must be combined with clinical observations, patient history, and epidemiological information.  Fact Sheet for Patients:   BoilerBrush.com.cyhttps://www.fda.gov/media/136312/download  Fact Sheet for Healthcare Providers: https://pope.com/https://www.fda.gov/media/136313/download  This test is not yet approved or  cleared by the [place] FDA and has been authorized for detection and/or diagnosis of SARS-CoV-2 by FDA under an Emergency Use Authorization (EUA).  This EUA will remain in effect (meaning this test can be used) for the duration of the COVID-19 declaration under Section 564(b)(1) of the Act, 21 U.S.C. section 360bbb-3(b)(1), unless the authorization is terminated or revoked sooner.  Performed at [facility], [address] [place], [place] [address]     RADIOLOGY:  CT ANGIO HEAD W OR WO CONTRAST  Result Date: 05/17/2020 CLINICAL DATA:  Stroke code.  Left-sided numbness since 8 a.m. EXAM: CT ANGIOGRAPHY HEAD AND NECK TECHNIQUE: Multidetector CT imaging of  the head and neck was performed using the standard protocol during bolus administration of intravenous contrast. Multiplanar CT image reconstructions and MIPs were obtained to evaluate the vascular anatomy. Carotid stenosis measurements (when applicable) are obtained utilizing NASCET criteria, using the distal internal carotid diameter as the denominator. CONTRAST:  75mL OMNIPAQUE IOHEXOL 350 MG/ML SOLN COMPARISON:  Concurrently performed noncontrast head CT. FINDINGS: CTA NECK FINDINGS Aortic arch: Common origin of the innominate and left common carotid arteries. No hemodynamically significant innominate or proximal subclavian artery stenosis. Right carotid system: CCA and ICA patent within the neck without stenosis. Left carotid system: Streak  artifact from a dense left-sided contrast bolus limits evaluation of the origin of the left common carotid artery. Within this limitation, the CCA and ICA are patent within the neck without stenosis. Vertebral arteries: Codominant and patent within the neck without stenosis. Skeleton: No acute bony abnormality or aggressive osseous lesion. Other neck: No neck mass or cervical lymphadenopathy. Thyroid unremarkable. Upper chest: No consolidation within the imaged lung apices. Review of the MIP images confirms the above findings CTA HEAD FINDINGS Anterior circulation: The intracranial internal carotid arteries are patent without significant stenosis. The M1 middle cerebral arteries are patent without significant stenosis. No M2 proximal branch occlusion or high-grade proximal stenosis is identified. The anterior cerebral arteries are patent bilaterally without significant proximal stenosis. No intracranial aneurysm is identified. Posterior circulation: The intracranial vertebral arteries are patent without significant stenosis, as is the basilar artery. The posterior cerebral arteries are patent bilaterally without significant proximal stenosis. Hypoplastic right P1 segment with a sizable right posterior communicating artery. The left posterior communicating artery is hypoplastic or absent. Venous sinuses: Within limitations of contrast timing, no convincing thrombus. Anatomic variants: As described Review of the MIP images confirms the above findings These results were called by telephone at the time of interpretation on 05/17/2020 at 9:42 am to provider Dr. Marisa Severin, who verbally acknowledged these results. IMPRESSION: CTA neck: Streak artifact from a dense left-sided contrast bolus limits evaluation of the origin of the left CCA. Within this limitation, the common carotid, internal carotid and vertebral arteries are patent within the neck without hemodynamically significant stenosis. CTA head: No evidence of  intracranial large vessel occlusion or proximal high-grade arterial stenosis. Electronically Signed   By: Jackey Loge DO   On: 05/17/2020 09:42   CT ANGIO NECK W OR WO CONTRAST  Result Date: 05/17/2020 CLINICAL DATA:  Stroke code.  Left-sided numbness since 8 a.m. EXAM: CT ANGIOGRAPHY HEAD AND NECK TECHNIQUE: Multidetector CT imaging of the head and neck was performed using the standard protocol during bolus administration of intravenous contrast. Multiplanar CT image reconstructions and MIPs were obtained to evaluate the vascular anatomy. Carotid stenosis measurements (when applicable) are obtained utilizing NASCET criteria, using the distal internal carotid diameter as the denominator. CONTRAST:  75mL OMNIPAQUE IOHEXOL 350 MG/ML SOLN COMPARISON:  Concurrently performed noncontrast head CT. FINDINGS: CTA NECK FINDINGS Aortic arch: Common origin of the innominate and left common carotid arteries. No hemodynamically significant innominate or proximal subclavian artery stenosis. Right carotid system: CCA and ICA patent within the neck without stenosis. Left carotid system: Streak artifact from a dense left-sided contrast bolus limits evaluation of the origin of the left common carotid artery. Within this limitation, the CCA and ICA are patent within the neck without stenosis. Vertebral arteries: Codominant and patent within the neck without stenosis. Skeleton: No acute bony abnormality or aggressive osseous lesion. Other neck: No neck mass or cervical lymphadenopathy. Thyroid unremarkable.  Upper chest: No consolidation within the imaged lung apices. Review of the MIP images confirms the above findings CTA HEAD FINDINGS Anterior circulation: The intracranial internal carotid arteries are patent without significant stenosis. The M1 middle cerebral arteries are patent without significant stenosis. No M2 proximal branch occlusion or high-grade proximal stenosis is identified. The anterior cerebral arteries are patent  bilaterally without significant proximal stenosis. No intracranial aneurysm is identified. Posterior circulation: The intracranial vertebral arteries are patent without significant stenosis, as is the basilar artery. The posterior cerebral arteries are patent bilaterally without significant proximal stenosis. Hypoplastic right P1 segment with a sizable right posterior communicating artery. The left posterior communicating artery is hypoplastic or absent. Venous sinuses: Within limitations of contrast timing, no convincing thrombus. Anatomic variants: As described Review of the MIP images confirms the above findings These results were called by telephone at the time of interpretation on 05/17/2020 at 9:42 am to provider Dr. Marisa Severin, who verbally acknowledged these results. IMPRESSION: CTA neck: Streak artifact from a dense left-sided contrast bolus limits evaluation of the origin of the left CCA. Within this limitation, the common carotid, internal carotid and vertebral arteries are patent within the neck without hemodynamically significant stenosis. CTA head: No evidence of intracranial large vessel occlusion or proximal high-grade arterial stenosis. Electronically Signed   By: Jackey Loge DO   On: 05/17/2020 09:42   MR BRAIN WO CONTRAST  Result Date: 05/17/2020 CLINICAL DATA:  Stroke suspected. EXAM: MRI HEAD WITHOUT CONTRAST TECHNIQUE: Multiplanar, multiecho pulse sequences of the brain and surrounding structures were obtained without intravenous contrast. COMPARISON:  Head CT May 17, 2020 FINDINGS: Brain: No acute infarction, hemorrhage, hydrocephalus, extra-axial collection or mass lesion. The brain parenchyma has normal morphology and signal characteristics. Vascular: Normal flow voids. Skull and upper cervical spine: Normal marrow signal. Sinuses/Orbits: Negative. Other: None. IMPRESSION: Unremarkable MRI of the brain. Electronically Signed   By: Baldemar Lenis M.D.   On: 05/17/2020 15:04    ECHOCARDIOGRAM COMPLETE  Result Date: 05/17/2020    ECHOCARDIOGRAM REPORT   Patient Name:   Melinda Morris Date of Exam: 05/17/2020 Medical Rec #:  [id number]            Height:       64.0 in Accession #:    [id number]           Weight:       140.0 lb Date of Birth:  1981/06/05            BSA:          1.681 m Patient Age:    38 years             BP:           132/83 mmHg Patient Gender: F                    HR:           73 bpm. Exam Location:  [facility] Procedure: 2D Echo, Cardiac Doppler and Color Doppler Indications:     Stroke 434.91  History:         Patient has no prior history of Echocardiogram examinations.                  Risk Factors:Hypertension. Irregular heart beat.  Sonographer:     Cristela Blue RDCS (AE) Referring Phys:  [id number] Lucile Shutters Diagnosing Phys: Lorine Bears MD  Sonographer Comments: Suboptimal apical window. IMPRESSIONS  1. Left ventricular ejection  fraction, by estimation, is 60 to 65%. The left ventricle has normal function. The left ventricle has no regional wall motion abnormalities. Left ventricular diastolic parameters were normal.  2. Right ventricular systolic function is normal. The right ventricular size is normal. Tricuspid regurgitation signal is inadequate for assessing PA pressure.  3. The mitral valve is normal in structure. No evidence of mitral valve regurgitation. No evidence of mitral stenosis.  4. The aortic valve is normal in structure. Aortic valve regurgitation is not visualized. No aortic stenosis is present.  5. The inferior vena cava is normal in size with greater than 50% respiratory variability, suggesting right atrial pressure of 3 mmHg. FINDINGS  Left Ventricle: Left ventricular ejection fraction, by estimation, is 60 to 65%. The left ventricle has normal function. The left ventricle has no regional wall motion abnormalities. The left ventricular internal cavity size was normal in size. There is  borderline left ventricular hypertrophy. Left  ventricular diastolic parameters were normal. Right Ventricle: The right ventricular size is normal. No increase in right ventricular wall thickness. Right ventricular systolic function is normal. Tricuspid regurgitation signal is inadequate for assessing PA pressure. The tricuspid regurgitant velocity is 2.12 m/s, and with an assumed right atrial pressure of 10 mmHg, the estimated right ventricular systolic pressure is 28.0 mmHg. Left Atrium: Left atrial size was normal in size. Right Atrium: Right atrial size was normal in size. Pericardium: There is no evidence of pericardial effusion. Mitral Valve: The mitral valve is normal in structure. Normal mobility of the mitral valve leaflets. No evidence of mitral valve regurgitation. No evidence of mitral valve stenosis. Tricuspid Valve: The tricuspid valve is normal in structure. Tricuspid valve regurgitation is not demonstrated. No evidence of tricuspid stenosis. Aortic Valve: The aortic valve is normal in structure. Aortic valve regurgitation is not visualized. No aortic stenosis is present. Aortic valve mean gradient measures 3.7 mmHg. Aortic valve peak gradient measures 6.7 mmHg. Aortic valve area, by VTI measures 2.10 cm. Pulmonic Valve: The pulmonic valve was normal in structure. Pulmonic valve regurgitation is not visualized. No evidence of pulmonic stenosis. Aorta: The aortic root is normal in size and structure. Venous: The inferior vena cava is normal in size with greater than 50% respiratory variability, suggesting right atrial pressure of 3 mmHg. IAS/Shunts: No atrial level shunt detected by color flow Doppler.  LEFT VENTRICLE PLAX 2D LVIDd:         3.34 cm  Diastology LVIDs:         1.94 cm  LV e' lateral:   15.20 cm/s LV PW:         1.04 cm  LV E/e' lateral: 5.4 LV IVS:        1.15 cm  LV e' medial:    6.96 cm/s LVOT diam:     2.00 cm  LV E/e' medial:  11.8 LV SV:         53 LV SV Index:   32 LVOT Area:     3.14 cm  RIGHT VENTRICLE RV Basal diam:  3.43  cm RV S prime:     13.20 cm/s TAPSE (M-mode): 3.9 cm LEFT ATRIUM             Index       RIGHT ATRIUM          Index LA diam:        2.60 cm 1.55 cm/m  RA Area:     9.93 cm LA Vol (A2C):   25.8 ml 15.35  ml/m RA Volume:   17.80 ml 10.59 ml/m LA Vol (A4C):   26.6 ml 15.82 ml/m LA Biplane Vol: 27.9 ml 16.60 ml/m  AORTIC VALVE                   PULMONIC VALVE AV Area (Vmax):    1.97 cm    RVOT Peak grad: 3 mmHg AV Area (Vmean):   2.03 cm AV Area (VTI):     2.10 cm AV Vmax:           129.67 cm/s AV Vmean:          88.000 cm/s AV VTI:            0.254 m AV Peak Grad:      6.7 mmHg AV Mean Grad:      3.7 mmHg LVOT Vmax:         81.30 cm/s LVOT Vmean:        56.800 cm/s LVOT VTI:          0.170 m LVOT/AV VTI ratio: 0.67  AORTA Ao Root diam: 2.40 cm MITRAL VALVE               TRICUSPID VALVE MV Area (PHT): 3.27 cm    TR Peak grad:   18.0 mmHg MV Decel Time: 232 msec    TR Vmax:        212.00 cm/s MV E velocity: 82.00 cm/s MV A velocity: 56.80 cm/s  SHUNTS MV E/A ratio:  1.44        Systemic VTI:  0.17 m                            Systemic Diam: 2.00 cm Lorine Bears MD Electronically signed by Lorine Bears MD Signature Date/Time: 05/17/2020/5:00:04 PM    Final    CT HEAD CODE STROKE WO CONTRAST  Result Date: 05/17/2020 CLINICAL DATA:  Code stroke. Left-sided numbness beginning at 8 a.m. Possible stroke; neuro deficit, acute, stroke suspected. EXAM: CT HEAD WITHOUT CONTRAST TECHNIQUE: Contiguous axial images were obtained from the base of the skull through the vertex without intravenous contrast. COMPARISON:  Head CT 09/08/2019. FINDINGS: Brain: Cerebral volume is normal for age. There is no acute intracranial hemorrhage. No demarcated cortical infarct is identified. No extra-axial fluid collection. No evidence of intracranial mass. No midline shift. Vascular: No hyperdense vessel. Skull: Normal. Negative for fracture or focal lesion. Sinuses/Orbits: Visualized orbits show no acute finding. No significant  paranasal sinus disease or mastoid effusion at the imaged levels. ASPECTS (Alberta Stroke Program Early CT Score) - Ganglionic level infarction (caudate, lentiform nuclei, internal capsule, insula, M1-M3 cortex): 7 - Supraganglionic infarction (M4-M6 cortex): 3 Total score (0-10 with 10 being normal): 10 These results were called by telephone at the time of interpretation on 05/17/2020 at 9:28 am to provider Dr. Marisa Severin, who verbally acknowledged these results. IMPRESSION: No evidence of acute intracranial hemorrhage or acute demarcated cortical infarction. ASPECTS is 10. Electronically Signed   By: Jackey Loge DO   On: 05/17/2020 09:28    Management plans discussed with the patient, ans she is in agreement.  CODE STATUS:     Code Status Orders  (From admission, onward)         Start     Ordered   05/17/20 1140  Full code  Continuous        05/17/20 1141        Code Status History  This patient has a current code status but no historical code status.   Advance Care Planning Activity      TOTAL TIME TAKING CARE OF THIS PATIENT: 34 minutes.    [name] M.D on 05/18/2020 at 3:59 PM  Between 7am to 6pm - Pager - [phone]  After 6pm go to [email or web address] - password [email or web address]  [facility]  CC: Primary care physician; Sandrea Hughs, NP

## 2020-05-18 NOTE — Evaluation (Signed)
Occupational Therapy Evaluation Patient Details Name: Melinda Morris MRN: [id number] DOB: June 07, 1981 Today's Date: 05/18/2020    History of Present Illness Melinda Morris is a 39 y.o. 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    Clinical Impression   [name] was seen for OT evaluation this date. Prior to hospital admission, pt was Independent c I/ADLs and mobility including working full time. Pt lives c husband and 4 children who assist with IADLs as needed. Currently pt demonstrates baseline independence to perform ADL and mobility tasks and no strength, coordination, cognitive, or visual deficits appreciated with assessment. Pt requires Supervision only for toileting/bathing tasks. Pt would benefit from skilled OT to provide energy conservation strategies in order to maximize safety and independence while minimizing falls risk and caregiver burden. Upon hospital discharge, recommend no follow up OT required.    Follow Up Recommendations  No OT follow up    Equipment Recommendations  None recommended by OT    Recommendations for Other Services       Precautions / Restrictions Precautions Precautions: None Restrictions Weight Bearing Restrictions: No      Mobility Bed Mobility Overal bed mobility: Independent                Transfers Overall transfer level: Independent Equipment used: None             General transfer comment: Comes easily to standing at EOB from regular bed height. Reaches outside BOS and marches in place c no LOBs    Balance Overall balance assessment: Independent                                         ADL either performed or assessed with clinical judgement   ADL Overall ADL's : Modified independent                                        General ADL Comments: MOD I don/doff B sock seated EOB easily and quickly. Independent meal setup seated EOC. Supervision for line management ADL t/f     Vision         Perception     Praxis      Pertinent Vitals/Pain Pain Assessment: No/denies pain     Hand Dominance     Extremity/Trunk Assessment Upper Extremity Assessment Upper Extremity Assessment: Overall WFL for tasks assessed (B grip 5/5 )   Lower Extremity Assessment Lower Extremity Assessment: Overall WFL for tasks assessed       Communication Communication Communication: No difficulties   Cognition Arousal/Alertness: Awake/alert Behavior During Therapy: WFL for tasks assessed/performed Overall Cognitive Status: Within Functional Limits for tasks assessed                                     General Comments       Exercises Exercises: Other exercises Other Exercises Other Exercises: Pt educated re: OT role, d/c recs, DME recs, falls prevention, ECS Other Exercises: LBD, meal setup, sup>sit, sit<>stand, in room ~ 15 ft functional  mobility, functional reach inside/outside BOS, sitting/standing balance/tolerance   Shoulder Instructions      Home Living Family/patient expects to be discharged to:: Private residence Living Arrangements: Spouse/significant other;Children Available Help at Discharge: Family;Available PRN/intermittently Type of Home: House Home Access: Level entry     Home Layout: One level     Bathroom Shower/Tub: Walk-in shower         Home Equipment: Shower seat - built in          Prior Functioning/Environment Level of Independence: Independent        Comments: Pt was independnet in all I/ADLs including working full time (on feet t/o day for work)        OT Problem List: Decreased activity tolerance;Impaired sensation      OT Treatment/Interventions: Self-care/ADL training;Therapeutic exercise;Energy  conservation;Therapeutic activities;Patient/family education    OT Goals(Current goals can be found in the care plan section) Acute Rehab OT Goals Patient Stated Goal: To return to work  OT Goal Formulation: With patient Time For Goal Achievement: 06/01/20 Potential to Achieve Goals: Good ADL Goals Additional ADL Goal #1: Pt will Independently verbalize plan to implement x3 ECS Additional ADL Goal #2: Pt will independnelty verbalize plan to implement x3 falls prevention strategies  OT Frequency: Min 1X/week   Barriers to D/C:            Co-evaluation              AM-PAC OT "6 Clicks" Daily Activity     Outcome Measure Help from another person eating meals?: None Help from another person taking care of personal grooming?: None Help from another person toileting, which includes using toliet, bedpan, or urinal?: A Little Help from another person bathing (including washing, rinsing, drying)?: A Little Help from another person to put on and taking off regular upper body clothing?: None Help from another person to put on and taking off regular lower body clothing?: None 6 Click Score: 22   End of Session    Activity Tolerance: Patient tolerated treatment well Patient left: in chair;with call bell/phone within reach (MD in room at end of session)  OT Visit Diagnosis: Other abnormalities of gait and mobility (R26.89)                Time: 0160-1093 OT Time Calculation (min): 15 min Charges:  OT General Charges $OT Visit: 1 Visit OT Evaluation $OT Eval Low Complexity: 1 Low OT Treatments $Self Care/Home Management : 8-22 mins  Kathie Dike, M.S. OTR/L  05/18/20, 9:48 AM

## 2020-05-18 NOTE — Progress Notes (Signed)
Pt for discharge home. A/o no resp distress. Instructions discussed with pt  meds diet activity and f/u discussed. Verbalized understanding.  Sl d/cd.  Work note given and pt  Ready just waiting on ride to pick her up.

## 2020-05-18 NOTE — Plan of Care (Signed)

## 2020-05-18 NOTE — Progress Notes (Signed)
Patient ID: [name]  [facility] [place] at [facility] Melinda Morris was admitted to the Hospital on 05/17/2020 and Discharged  05/18/2020 and should be excused from work/school   for 3 days starting 05/17/2020 , may return to work/school without any restrictions on 05/20/2020.  [name] M.D on 05/18/2020,at 9:43 AM  [facility] [place] at [facility]

## 2020-05-19 LAB — CARDIOLIPIN ANTIBODIES, IGG, IGM, IGA
Anticardiolipin IgA: <9 APL U/mL (ref 0–11)
Anticardiolipin IgG: <9 GPL U/mL (ref 0–14)
Anticardiolipin IgM: 15 MPL U/mL — ABNORMAL HIGH (ref 0–12)

## 2020-05-19 LAB — BETA-2-GLYCOPROTEIN I ABS, IGG/M/A
Beta-2 Glyco I IgG: <9 GPI IgG units (ref 0–20)
Beta-2-Glycoprotein I IgA: <9 GPI IgA units (ref 0–25)
Beta-2-Glycoprotein I IgM: <9 GPI IgM units (ref 0–32)

## 2020-05-19 LAB — HOMOCYSTEINE: Homocysteine: 5.7 umol/L (ref 0.0–14.5)

## 2020-05-20 LAB — PROTEIN C, TOTAL: Protein C, Total: 100 % (ref 60–150)

## 2020-05-22 LAB — PROTHROMBIN GENE MUTATION

## 2020-05-22 LAB — FACTOR 5 LEIDEN

## 2020-05-23 LAB — PROTEIN C ACTIVITY: Protein C Activity: 126 % (ref 73–180)

## 2020-05-23 LAB — PROTEIN S, TOTAL: Protein S Ag, Total: 88 % (ref 60–150)

## 2020-05-23 LAB — PROTEIN S ACTIVITY: Protein S Activity: 99 % (ref 63–140)

## 2020-05-23 LAB — LUPUS ANTICOAGULANT PANEL
DRVVT: 27.1 s (ref 0.0–47.0)
PTT Lupus Anticoagulant: 32.1 s (ref 0.0–51.9)

## 2020-12-28 ENCOUNTER — Emergency Department (HOSPITAL_COMMUNITY): Payer: Self-pay

## 2020-12-28 ENCOUNTER — Emergency Department (HOSPITAL_COMMUNITY)
Admission: EM | Admit: 2020-12-28 | Discharge: 2020-12-28 | Disposition: A | Discharge status: Home / Self Care | Payer: Self-pay | Attending: Emergency Medicine | Admitting: Emergency Medicine

## 2020-12-28 ENCOUNTER — Encounter (HOSPITAL_COMMUNITY): Payer: Self-pay | Admitting: Emergency Medicine

## 2020-12-28 DIAGNOSIS — G43809 Other migraine, not intractable, without status migrainosus: Secondary | ICD-10-CM

## 2020-12-28 DIAGNOSIS — N939 Abnormal uterine and vaginal bleeding, unspecified: Secondary | ICD-10-CM

## 2020-12-28 DIAGNOSIS — G43009 Migraine without aura, not intractable, without status migrainosus: Secondary | ICD-10-CM

## 2020-12-28 DIAGNOSIS — Z79899 Other long term (current) drug therapy: Secondary | ICD-10-CM | POA: Insufficient documentation

## 2020-12-28 DIAGNOSIS — R1030 Lower abdominal pain, unspecified: Secondary | ICD-10-CM

## 2020-12-28 DIAGNOSIS — I10 Essential (primary) hypertension: Secondary | ICD-10-CM | POA: Insufficient documentation

## 2020-12-28 LAB — URINALYSIS, ROUTINE W REFLEX MICROSCOPIC
Bacteria, UA: NONE SEEN
Bilirubin Urine: NEGATIVE
Glucose, UA: NEGATIVE mg/dL
Ketones, ur: NEGATIVE mg/dL
Leukocytes,Ua: NEGATIVE
Nitrite: NEGATIVE
Protein, ur: NEGATIVE mg/dL
Specific Gravity, Urine: 1.004 — ABNORMAL LOW (ref 1.005–1.030)
pH: 8 (ref 5.0–8.0)

## 2020-12-28 LAB — COMPREHENSIVE METABOLIC PANEL
ALT: 34 U/L (ref 0–44)
AST: 23 U/L (ref 15–41)
Albumin: 4 g/dL (ref 3.5–5.0)
Alkaline Phosphatase: 48 U/L (ref 38–126)
Anion gap: 11 (ref 5–15)
BUN: 6 mg/dL (ref 6–20)
CO2: 23 mmol/L (ref 22–32)
Calcium: 9.4 mg/dL (ref 8.9–10.3)
Chloride: 102 mmol/L (ref 98–111)
Creatinine, Ser: 0.53 mg/dL (ref 0.44–1.00)
GFR, Estimated: >60 mL/min (ref >60)
Glucose, Bld: 96 mg/dL (ref 70–99)
Potassium: 3.5 mmol/L (ref 3.5–5.1)
Sodium: 136 mmol/L (ref 135–145)
Total Bilirubin: 0.8 mg/dL (ref 0.3–1.2)
Total Protein: 6.9 g/dL (ref 6.5–8.1)

## 2020-12-28 LAB — RAPID URINE DRUG SCREEN, HOSP PERFORMED
Amphetamines: NOT DETECTED
Barbiturates: NOT DETECTED
Benzodiazepines: NOT DETECTED
Cocaine: NOT DETECTED
Opiates: NOT DETECTED
Tetrahydrocannabinol: NOT DETECTED

## 2020-12-28 LAB — I-STAT BETA HCG BLOOD, ED (MC, WL, AP ONLY): I-stat hCG, quantitative: <5 m[IU]/mL (ref <5)

## 2020-12-28 LAB — DIFFERENTIAL
Abs Immature Granulocytes: 0.03 10*3/uL (ref 0.00–0.07)
Basophils Absolute: 0 10*3/uL (ref 0.0–0.1)
Basophils Relative: 0 %
Eosinophils Absolute: 0.1 10*3/uL (ref 0.0–0.5)
Eosinophils Relative: 1 %
Immature Granulocytes: 0 %
Lymphocytes Relative: 16 %
Lymphs Abs: 1.7 10*3/uL (ref 0.7–4.0)
Monocytes Absolute: 0.7 10*3/uL (ref 0.1–1.0)
Monocytes Relative: 6 %
Neutro Abs: 8.3 10*3/uL — ABNORMAL HIGH (ref 1.7–7.7)
Neutrophils Relative %: 77 %

## 2020-12-28 LAB — I-STAT CHEM 8, ED
BUN: 5 mg/dL — ABNORMAL LOW (ref 6–20)
Calcium, Ion: 1.04 mmol/L — ABNORMAL LOW (ref 1.15–1.40)
Chloride: 103 mmol/L (ref 98–111)
Creatinine, Ser: 0.4 mg/dL — ABNORMAL LOW (ref 0.44–1.00)
Glucose, Bld: 94 mg/dL (ref 70–99)
HCT: 36 % (ref 36.0–46.0)
Hemoglobin: 12.2 g/dL (ref 12.0–15.0)
Potassium: 3.5 mmol/L (ref 3.5–5.1)
Sodium: 137 mmol/L (ref 135–145)
TCO2: 24 mmol/L (ref 22–32)

## 2020-12-28 LAB — WET PREP, GENITAL
Clue Cells Wet Prep HPF POC: NONE SEEN
Sperm: NONE SEEN
Trich, Wet Prep: NONE SEEN
Yeast Wet Prep HPF POC: NONE SEEN

## 2020-12-28 LAB — APTT: aPTT: 28 seconds (ref 24–36)

## 2020-12-28 LAB — CBC
HCT: 35.9 % — ABNORMAL LOW (ref 36.0–46.0)
Hemoglobin: 12.3 g/dL (ref 12.0–15.0)
MCH: 30.1 pg (ref 26.0–34.0)
MCHC: 34.3 g/dL (ref 30.0–36.0)
MCV: 87.8 fL (ref 80.0–100.0)
Platelets: 209 10*3/uL (ref 150–400)
RBC: 4.09 MIL/uL (ref 3.87–5.11)
RDW: 12.5 % (ref 11.5–15.5)
WBC: 10.8 10*3/uL — ABNORMAL HIGH (ref 4.0–10.5)
nRBC: 0 % (ref 0.0–0.2)

## 2020-12-28 LAB — PROTIME-INR
INR: 1 (ref 0.8–1.2)
Prothrombin Time: 12.6 seconds (ref 11.4–15.2)

## 2020-12-28 LAB — ETHANOL: Alcohol, Ethyl (B): <10 mg/dL (ref <10)

## 2020-12-28 MED ORDER — ATORVASTATIN CALCIUM 80 MG PO TABS: 80.0000 mg | ORAL_TABLET | Freq: Every day | ORAL | 0 refills | Status: DC

## 2020-12-28 MED ORDER — SODIUM CHLORIDE 0.9 % IV BOLUS
1000.0000 mL | Freq: Once | INTRAVENOUS | Status: AC
  Administered 2020-12-28: 1000 mL via INTRAVENOUS

## 2020-12-28 MED ORDER — METOCLOPRAMIDE HCL 5 MG/ML IJ SOLN
10.0000 mg | Freq: Once | INTRAMUSCULAR | Status: AC
  Administered 2020-12-28: 10 mg via INTRAVENOUS
  Filled 2020-12-28: qty 2

## 2020-12-28 MED ORDER — NAPROXEN 500 MG PO TABS: 500.0000 mg | ORAL_TABLET | Freq: Two times a day (BID) | ORAL | 0 refills | Status: DC

## 2020-12-28 MED ORDER — DIPHENHYDRAMINE HCL 50 MG/ML IJ SOLN
25.0000 mg | Freq: Once | INTRAMUSCULAR | Status: AC
  Administered 2020-12-28: 25 mg via INTRAVENOUS
  Filled 2020-12-28: qty 1

## 2020-12-28 MED ORDER — KETOROLAC TROMETHAMINE 30 MG/ML IJ SOLN
30.0000 mg | Freq: Once | INTRAMUSCULAR | Status: AC
  Administered 2020-12-28: 30 mg via INTRAVENOUS
  Filled 2020-12-28: qty 1

## 2020-12-28 NOTE — ED Notes (Signed)
Pt transported to Ultrasound.  

## 2020-12-28 NOTE — ED Notes (Signed)
Pt transported to MRI 

## 2020-12-28 NOTE — Code Documentation (Signed)
Patient at work when she had an onset of left-sided weakness/tingling, facial droop, and blurry vision in the left eye. Her coworkers called 911. [facility] arrived and activated the code stroke. BP in the 150's and CBG WNL en route. Patient taken to [facility] and met by the EDP and stroke team. On assessment patient complains of blurry vision in the left eye and moderate stomach pain. Pt's facial droop would change with each request to smile. Her face was symmetrical on assessment and she did not have any weakness. Pt teary and holding her stomach. NIHSS 0. She was cleared and taken for CT. See results below. 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 Care Plan: Q55mn vitals and Q30 mNIHSS/neuro checks until outside the window for TPA at 1730. Routine workup. Hand off with Autumn RN. Loris Winrow, SRande Brunt RN   "IMPRESSION: 1. Negative CT head 2. ASPECTS is 10 3. Code stroke imaging results were communicated on 12/28/2020 at 2:39 pm to [facility] text page"

## 2020-12-28 NOTE — ED Triage Notes (Signed)
Pt transported from work by [employer] for c/o blurred vision and facial droop. Pt speaks limited English, pt A & O c/o lower abd pain on arrival. Neuro intact.  bilat #18 IV's.

## 2020-12-28 NOTE — ED Notes (Signed)
Pt ambulated to RR with slow and steady gait. Pt did well. Pt wanted husband to help in bathroom so I stayed in room. Pt has bathroom in room.

## 2020-12-28 NOTE — Discharge Instructions (Addendum)
The neurologist recommends restarting your Lipitor and baby aspirin 81 mg. You can take the naproxen as needed to help with your menstrual cramps. Follow-up with your primary care provider. Return to the ER if you start to experience worsening abdominal pain, if you experience chest pain, shortness of breath, numbness in arms or legs.  El neurlogo recomienda reiniciar su Lipitor y aspirina para bebs de 81 mg. Puede tomar el naproxeno segn sea necesario para ayudar con los [employer]. Seguimiento con su proveedor de Marine scientist. Regrese a la sala de emergencias si comienza a experimentar un empeoramiento del dolor abdominal, si experimenta dolor en el pecho, dificultad para respirar, entumecimiento en los brazos o las piernas.

## 2020-12-28 NOTE — ED Provider Notes (Signed)
[facility] EMERGENCY DEPARTMENT Provider Note   CSN: [id number] Arrival date & time: 12/28/20  1418  An emergency department physician performed an initial assessment on this suspected stroke patient at 1418.  History Chief Complaint  Patient presents with  . Code Stroke    Melinda Morris is a 40 y.o. female with a past medical history of hypertension, hyperlipidemia who presents to ED as a code stroke.  States that she woke up this morning with severe lower abdominal pain after starting her menstrual cycle.  She feels that her menstrual cycles a few days early.  She sometimes gets menstrual cramps but states that now feels worse.  She tried taking Tylenol with only minimal improvement in her pain.  She went to work and around 1:00 developed blurry vision and left-sided facial droop as well as facial numbness.  States that this happened to her last year and was told that she was admitted for a "mini stroke."  She does not take any daily medications despite being told to take aspirin and Lipitor daily.  She called reports a headache that gradually worsened at work as well.  Denies any injuries or falls.  No prior abdominal surgeries.  Denies any vaginal discharge.  No syncope.  No chest pain, shortness of breath, vomiting or diarrhea.  No anticoagulant use.  HPI     Past Medical History:  Diagnosis Date  . Hypertension   . Irregular heart beat     Patient Active Problem List   Diagnosis Date Noted  . Numbness   . Hyperlipidemia   . Anxiety   . TIA (transient ischemic attack) 05/17/2020  . Hypertension     Past Surgical History:  Procedure Laterality Date  . SHOULDER SURGERY Left      OB History   No obstetric history on file.     No family history on file.  Social History   Tobacco Use  . Smoking status: Never Smoker  . Smokeless tobacco: Never Used  Substance Use Topics  . Alcohol use: Yes  . Drug use: Not Currently    Home  Medications Prior to Admission medications   Medication Sig Start Date End Date Taking? Authorizing Provider  atorvastatin (LIPITOR) 80 MG tablet Take 1 tablet (80 mg total) by mouth daily for 15 days. 12/28/20 01/12/21 Yes Hubbert Landrigan, PA-C  naproxen (NAPROSYN) 500 MG tablet Take 1 tablet (500 mg total) by mouth 2 (two) times daily. 12/28/20  Yes Maan Zarcone, PA-C    Allergies    Patient has no known allergies.  Review of Systems   Review of Systems  Constitutional: Negative for appetite change, chills and fever.  HENT: Negative for ear pain, rhinorrhea, sneezing and sore throat.   Eyes: Negative for photophobia and visual disturbance.  Respiratory: Negative for cough, chest tightness, shortness of breath and wheezing.   Cardiovascular: Negative for chest pain and palpitations.  Gastrointestinal: Positive for abdominal pain. Negative for blood in stool, constipation, diarrhea, nausea and vomiting.  Genitourinary: Positive for vaginal bleeding. Negative for dysuria, hematuria and urgency.  Musculoskeletal: Negative for myalgias.  Skin: Negative for rash.  Neurological: Positive for facial asymmetry, numbness and headaches. Negative for dizziness, weakness and light-headedness.    Physical Exam Updated Vital Signs BP 108/62   Pulse 68   Temp 98.1 F (36.7 C) (Oral)   Resp 18   Wt 66.4 kg   SpO2 100%   BMI 25.13 kg/m   Physical Exam Vitals and nursing note  reviewed. Exam conducted with a chaperone present.  Constitutional:      General: She is not in acute distress.    Appearance: She is well-developed and well-nourished.  HENT:     Head: Normocephalic and atraumatic.     Nose: Nose normal.  Eyes:     General: No scleral icterus.       Right eye: No discharge.        Left eye: No discharge.     Extraocular Movements: Extraocular movements intact and EOM normal.     Conjunctiva/sclera: Conjunctivae normal.     Pupils: Pupils are equal, round, and reactive to light.   Cardiovascular:     Rate and Rhythm: Normal rate and regular rhythm.     Pulses: Intact distal pulses.     Heart sounds: Normal heart sounds. No murmur heard. No friction rub. No gallop.   Pulmonary:     Effort: Pulmonary effort is normal. No respiratory distress.     Breath sounds: Normal breath sounds.  Abdominal:     General: Bowel sounds are normal. There is no distension.     Palpations: Abdomen is soft.     Tenderness: There is abdominal tenderness (Generalized). There is no guarding.  Genitourinary:    Vagina: Bleeding present.     Cervix: No cervical motion tenderness.     Uterus: Tender.      Adnexa:        Right: No tenderness or fullness.         Left: No tenderness or fullness.       Comments: Pelvic exam: normal external genitalia without evidence of trauma. VULVA: normal appearing vulva with no masses, tenderness or lesion. VAGINA: normal appearing vagina with normal color and discharge, moderate amount of blood in vaginal vault.  No lesions CERVIX: normal appearing cervix without lesions, cervical motion tenderness absent, cervical os closed with out purulent discharge; No vaginal discharge. Wet prep obtained ADNEXA: normal adnexa in size, nontender and no masses UTERUS: uterus is normal size, shape, consistency and mild tenderness on exam.  Musculoskeletal:        General: No edema. Normal range of motion.     Cervical back: Normal range of motion and neck supple.  Skin:    General: Skin is warm and dry.     Findings: No rash.  Neurological:     Mental Status: She is alert and oriented to person, place, and time.     Cranial Nerves: No cranial nerve deficit.     Sensory: Sensory deficit present.     Motor: No weakness or abnormal muscle tone.     Coordination: Coordination normal.     Comments: No facial asymmetry noted.  Patient reports "different" sensation to light touch of left side of her face.  Strength 5/5 in bilateral upper and lower extremities.  Normal  finger-to-nose coordination bilaterally.  No pronator drift.  Psychiatric:        Mood and Affect: Mood and affect normal.     ED Results / Procedures / Treatments   Labs (all labs ordered are listed, but only abnormal results are displayed) Labs Reviewed  WET PREP, GENITAL - Abnormal; Notable for the following components:      Result Value   WBC, Wet Prep HPF POC FEW (*)    All other components within normal limits  CBC - Abnormal; Notable for the following components:   WBC 10.8 (*)    HCT 35.9 (*)    All other  components within normal limits  DIFFERENTIAL - Abnormal; Notable for the following components:   Neutro Abs 8.3 (*)    All other components within normal limits  URINALYSIS, ROUTINE W REFLEX MICROSCOPIC - Abnormal; Notable for the following components:   Color, Urine STRAW (*)    Specific Gravity, Urine 1.004 (*)    Hgb urine dipstick LARGE (*)    All other components within normal limits  I-STAT CHEM 8, ED - Abnormal; Notable for the following components:   BUN 5 (*)    Creatinine, Ser 0.40 (*)    Calcium, Ion 1.04 (*)    All other components within normal limits  ETHANOL  PROTIME-INR  APTT  COMPREHENSIVE METABOLIC PANEL  RAPID URINE DRUG SCREEN, HOSP PERFORMED  I-STAT BETA HCG BLOOD, ED ([facility], [facility], [facility] ONLY)    EKG None  Radiology MR BRAIN WO CONTRAST  Result Date: 12/28/2020 CLINICAL DATA:  Neuro deficit, acute stroke suspected. EXAM: MRI HEAD WITHOUT CONTRAST TECHNIQUE: Multiplanar, multiecho pulse sequences of the brain and surrounding structures were obtained without intravenous contrast. COMPARISON:  Same day CT head.  MRI head May 17, 2020. FINDINGS: Brain: No acute infarction, hemorrhage, hydrocephalus, extra-axial collection or mass lesion. Single T2/FLAIR hyperintense lesion within the left frontal white matter, within normal limits for age and most likely related to chronic microvascular ischemic disease. Vascular: Major arterial flow voids are  maintained at the skull base. Skull and upper cervical spine: Normal marrow signal. Sinuses/Orbits: Inferior left maxillary sinus mucosal thickening and mild scattered ethmoid air cell mucosal thickening. Otherwise, clear sinuses without air-fluid levels. Unremarkable orbits. Other: No mastoid effusions. IMPRESSION: No evidence of acute abnormality.  No acute infarct. Electronically Signed   By: Feliberto Harts MD   On: 12/28/2020 16:48   US PELVIS (TRANSABDOMINAL ONLY)  Result Date: 12/28/2020 CLINICAL DATA:  Heavy vaginal bleeding x1 day EXAM: TRANSABDOMINAL ULTRASOUND OF PELVIS DOPPLER ULTRASOUND OF OVARIES TECHNIQUE: Transabdominal ultrasound examination of the pelvis was performed including evaluation of the uterus, ovaries, adnexal regions, and pelvic cul-de-sac. Color and duplex Doppler ultrasound was utilized to evaluate blood flow to the ovaries. COMPARISON:  None. FINDINGS: Uterus Measurements: 9.2 x 5.2 x 4.5 cm = volume: 114 mL. No fibroids or other mass visualized. Endometrium Thickness: 5 mm.  No focal abnormality visualized. Right ovary Measurements: 5.0 x 2.8 x 2.6 cm = volume: 19 mL. 2.2 cm dominant follicle. Left ovary Measurements: 4.6 x 2.4 x 2.1 cm = volume: 12 mL. Normal appearance/no adnexal mass. Pulsed Doppler evaluation demonstrates normal low-resistance arterial and venous waveforms in both ovaries. Other: None IMPRESSION: Unremarkable ultrasound of the pelvis. No evidence of ovarian torsion. Electronically Signed   By: Maudry Mayhew MD   On: 12/28/2020 18:25   US PELVIC DOPPLER (TORSION R/O OR MASS ARTERIAL FLOW)  Result Date: 12/28/2020 CLINICAL DATA:  Heavy vaginal bleeding x1 day EXAM: TRANSABDOMINAL ULTRASOUND OF PELVIS DOPPLER ULTRASOUND OF OVARIES TECHNIQUE: Transabdominal ultrasound examination of the pelvis was performed including evaluation of the uterus, ovaries, adnexal regions, and pelvic cul-de-sac. Color and duplex Doppler ultrasound was utilized to evaluate blood  flow to the ovaries. COMPARISON:  None. FINDINGS: Uterus Measurements: 9.2 x 5.2 x 4.5 cm = volume: 114 mL. No fibroids or other mass visualized. Endometrium Thickness: 5 mm.  No focal abnormality visualized. Right ovary Measurements: 5.0 x 2.8 x 2.6 cm = volume: 19 mL. 2.2 cm dominant follicle. Left ovary Measurements: 4.6 x 2.4 x 2.1 cm = volume: 12 mL. Normal appearance/no adnexal mass.  Pulsed Doppler evaluation demonstrates normal low-resistance arterial and venous waveforms in both ovaries. Other: None IMPRESSION: Unremarkable ultrasound of the pelvis. No evidence of ovarian torsion. Electronically Signed   By: Maudry MayhewJeffrey  Waltz MD   On: 12/28/2020 18:25   CT HEAD CODE STROKE WO CONTRAST  Result Date: 12/28/2020 CLINICAL DATA:  Code stroke.  Acute neuro deficit EXAM: CT HEAD WITHOUT CONTRAST TECHNIQUE: Contiguous axial images were obtained from the base of the skull through the vertex without intravenous contrast. COMPARISON:  CT head 05/17/2020.  MRI head 05/17/2020 FINDINGS: Brain: No evidence of acute infarction, hemorrhage, hydrocephalus, extra-axial collection or mass lesion/mass effect. Vascular: Negative for hyperdense vessel Skull: Negative Sinuses/Orbits: Negative Other: None ASPECTS (Alberta Stroke Program Early CT Score) - Ganglionic level infarction (caudate, lentiform nuclei, internal capsule, insula, M1-M3 cortex): 7 - Supraganglionic infarction (M4-M6 cortex): 3 Total score (0-10 with 10 being normal): 10 IMPRESSION: 1. Negative CT head 2. ASPECTS is 10 3. Code stroke imaging results were communicated on 12/28/2020 at 2:39 pm to provider [facility] via text page Electronically Signed   By: Marlan Palauharles  Clark M.D.   On: 12/28/2020 14:39    Procedures Procedures   Medications Ordered in ED Medications  ketorolac (TORADOL) 30 MG/ML injection 30 mg (has no administration in time range)  sodium chloride 0.9 % bolus 1,000 mL (1,000 mLs Intravenous New Bag/Given 12/28/20 1605)  metoCLOPramide (REGLAN)  injection 10 mg (10 mg Intravenous Given 12/28/20 1603)  diphenhydrAMINE (BENADRYL) injection 25 mg (25 mg Intravenous Given 12/28/20 1604)    ED Course  I have reviewed the triage vital signs and the nursing notes.  Pertinent labs & imaging results that were available during my care of the patient were reviewed by me and considered in my medical decision making (see chart for details).  Clinical Course as of 12/28/20 1833  Caleen EssexFri Dec 28, 2020  1532 Spoke to on-call neuro team.  They feel that we can cancel code stroke but recommend MRI of the brain.  She had an admission for possible TIA last year with negative CT angios at that time.  Her CT today is without any abnormalities.  She continues to complain of numbness to the left side of her face as well as a headache and blurry vision.  Will assess for improvement after migraine cocktail. [HK]  1533 Patient clarifies that she did take the baby aspirin and Lipitor for the first 30 days she was prescribed it but did not take it after this. [HK]  1700 MR BRAIN WO CONTRAST No acute abnormalities.  No acute infarct. [HK]    Clinical Course User Index [HK] Dietrich PatesKhatri, Revella Shelton, PA-C   MDM Rules/Calculators/A&P                          40 year old female with past medical history of hypertension, hyperlipidemia presenting to the ED as a code stroke.  Woke up this morning with severe lower abdominal pain after starting her menstrual cycle which she felt was heavier than usual.  She was at work around 1 PM when she developed blurry vision and left-sided facial droop and numbness.  Since this is happened to her last year and was diagnosed with "mini stroke."  She was told to start aspirin and Lipitor but did not take this after she ran out 30 days after admission.  Denies any syncope, chest pain, shortness of breath, vaginal discharge.  On exam patient with generalized abdominal tenderness.  She has some subjective  numbness noted to the left side of her face that she  states that "a different" sensation to light touch.  No facial asymmetry noted.  Strength and sensation intact in bilateral upper and lower extremities.  She is alert and oriented x4 to her baseline.  Patient was evaluated by neurology team at the bedside as she presented as a code stroke.  They did not feel that TPA was warranted due to her NIH score exam.  They did recommend MRI of the brain.  CT of the head was negative.  Lab work here shows negative hCG, normal hemoglobin and no leukocytosis.  Normal creatinine here.  Pelvic exam revealed moderate amount of vaginal bleeding with uterine tenderness without any cervical motion tenderness or other abnormalities.  Wet prep with a few WBCs but otherwise unremarkable.  She is not concerned about STDs.  MRI of the brain without any abnormalities.  I will treat with a migraine cocktail as she does endorse a headache and consider complicated migraine.  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  Awaiting pelvic ultrasound to evaluate for further cause of her abnormal vaginal bleeding.  Pelvic ultrasound without any abnormalities. No signs of torsion or masses. Patient symptoms improved here. Repeat neurological exam without any complaints of numbness. She is resting comfortably. Suspect that her symptoms could be due to her menstrual cramps causing anxiety and possible complicated migraine. Will treat with NSAIDs and restart her Lipitor and aspirin as requested by neurology. Patient is agreeable to the plan and is requesting discharge home. I doubt intra-abdominal cause of her pain such as appendicitis, cholecystitis, diverticulitis or other surgical cause based on her reassuring work-up and imaging. Return precautions given   Patient is hemodynamically stable, in NAD, and able to ambulate in the ED. Evaluation does not show pathology that would require ongoing emergent intervention or  inpatient treatment. I explained the diagnosis to the patient. Pain has been managed and has no complaints prior to discharge. Patient is comfortable with above plan and is stable for discharge at this time. All questions were answered prior to disposition. Strict return precautions for returning to the ED were discussed. Encouraged follow up with PCP.   An After Visit Summary was printed and given to the patient.   Portions of this note were generated with Scientist, clinical (histocompatibility and immunogenetics). Dictation errors may occur despite best attempts at proofreading.  Final Clinical Impression(s) / ED Diagnoses Final diagnoses:  Abnormal vaginal bleeding  Other migraine without status migrainosus, not intractable    Rx / DC Orders ED Discharge Orders         Ordered    atorvastatin (LIPITOR) 80 MG tablet  Daily        12/28/20 1817    naproxen (NAPROSYN) 500 MG tablet  2 times daily        12/28/20 1831           Dietrich Pates, PA-C 12/28/20 1833    Blane Ohara, MD 01/04/21 1213

## 2020-12-28 NOTE — Consult Note (Signed)
Neurology Code Stroke Consult H&P  CC: CODE STROKE  History is obtained from: EMS  HPI: Melinda Morris is a 40 y.o. female with a PMHx of HTN who presented to [facility] ED via EMS as a CODE STROKE. Per EMS, patient was at work today and last seen well at 1300 hrs. Afterwards, she had left facial droop with mild LUE weakness. Patient is c/o posterior HA which is sharp and blurry vision in the left eye. CBG 122 and BP 150s in the field.   On exam, NP does not appreciate any neurological deficits. She is teary on exam and is c/o more than usual bleeding with her period and also pain.   Patient taken to CT. CT head negative. No further testing in CT.   Patient non compliant with Lipitor and ASA per EDP.   In review of chart, patient was here in June 2021 for left face and arm numbness that lasted only a few minutes and resolved spontaneously. Entire imaging work up was negative. Dx was TIA and patient was discharged on Atorvastatin and ASA.    LKW: 1300 hrs tpa given?: No IR Thrombectomy? No, no LVO  MRS 0  NIHSS:  1a Level of Conscious: 0 1b LOC Questions: 0 1c LOC Commands: 0 2 Best Gaze: 0 3 Visual: 0 4 Facial Palsy: 0 5a Motor Arm - left: 0 5b Motor Arm - Right: 0 6a Motor Leg - Left: 0 6b Motor Leg - Right: 0 7 Limb Ataxia: 0 8 Sensory: 0 9 Best Language: 0 10 Dysarthria: 0 11 Extinct. and Inattention: 0 TOTAL:0   ROS: A complete ROS was performed and is negative except as noted in the HPI.   Past Medical History:  Diagnosis Date  . Hypertension   . Irregular heart beat   HLD  No family history on file.  Social History:  reports that she has never smoked. She has never used smokeless tobacco. She reports current alcohol use. She reports previous drug use.   Prior to Admission medications   Medication Sig Start Date End Date Taking? Authorizing Provider  aspirin EC 81 MG tablet Take 1 tablet (81 mg total) by mouth daily. Swallow whole. 05/18/20 05/18/21  [name], MD  atorvastatin (LIPITOR) 80 MG tablet Take 1 tablet (80 mg total) by mouth daily. 05/18/20   [name], MD    Exam: Current vital signs: BP 123/71   Pulse 69   Temp 98.1 F (36.7 C) (Oral)   Resp (!) 21   Wt 66.4 kg   SpO2 96%   BMI 25.13 kg/m   Physical Exam  Constitutional: Appears well-developed and well-nourished.  Psych: Affect appropriate to situation Eyes: No scleral injection HENT: No OP obstrucion Head: Normocephalic.  Cardiovascular: RRR  Respiratory: Effort normal.  GI: Soft.  Skin: WDI  Neuro: Mental Status: Patient is awake, alert, oriented to person, place, month, year, and situation. Patient is able to give a clear and coherent history.  No signs of aphasia or neglect. Speech/Language: Speech is intact and fluent. No dysarthria. Comprehension, repetition, and naming intact.  Cranial Nerves: II: Visual Fields are full. Pupils are equal, round, and reactive to light. III,IV, VI: EOMI without ptosis or diploplia.  V: Facial sensation is symmetric to light touch. Able to move jaw back and forth.  VII: Smile is symmetrical. Able to puff cheeks and raise eyebrows.  VIII: hearing is intact to voice IX, X: Uvula elevates symmetrically. Phonation normal.  XI: Shoulder shrug is  symmetric. XII: tongue is midline without atrophy or fasciculations.  Motor: Tone is normal. Bulk is normal. 5/5 strength was present in all four extremities. Sensory: Sensation is symmetric to light touch in the arms and legs. Extinction intact.  Plantars: Toes are downgoing bilaterally. Cerebellar: FNF intact bilaterally. No drift UE/LE.  Gait: deferred.   I have reviewed labs in epic and the pertinent results are:  INR  1.0          LDL  137 6/21        TSH  3.90 6/21         HbA1c 5.4 6/21  I have reviewed the images obtained: NCT head showed no acute abnormality  Assessment: Melinda Morris is a 40 y.o. female PMHx of HTN and HLD who presents today as a  CODE STROKE. CT head negative for acute findings. Patient is non compliant with previously prescribed Lipitor and ASA 81mg . Will get MRI brain. Very low suspicion for stroke. Do not feel any further workup is necessary. NP spoke with PA in ED about HA. Will treat for migraine.   Impression:  1. Subjective weakness and facial droop.  2. HA. ? migraine  Plan: - MRI brain without contrast. - If patient is admitted, check lipid panel.  - Restart Lipitor (patient not taking) - Restart Aspirin 81mg  daily (patient not taking).. - SBP goal-normotensive  If MRI brain is negative, neurology has no further recommendations.   This patient is critically ill and at significant risk of neurological worsening, death and care requires constant monitoring of vital signs, hemodynamics,respiratory and cardiac monitoring, neurological assessment, discussion with family, other specialists and medical decision making of high complexity. I spent 35 minutes of neurocritical care time  in the care of  this patient. This was time spent independent of any time provided by nurse practitioner or PA.  Pt seen by , NP and MD. Note/plan to be edited by MD as necessary.  Pager: 

## 2021-02-19 ENCOUNTER — Ambulatory Visit: Payer: Self-pay | Admitting: Internal Medicine

## 2021-03-17 IMAGING — MR MR HEAD W/O CM
7 of 11 series · 25 of 48 positions shown · non-contrast
Comparison: Same day CT head.  MRI head May 17, 2020.

CLINICAL DATA: Neuro deficit, acute stroke suspected.

EXAM:
MRI HEAD WITHOUT CONTRAST
TECHNIQUE: Multiplanar, multiecho pulse sequences of the brain and surrounding
structures were obtained without intravenous contrast.

[Series 2: DWI · axial · 3.0mm · 0.94mm/px · z∈[-101,+33]mm · 8 of 98 slices shown (1 of 2)]
[im 1/98]
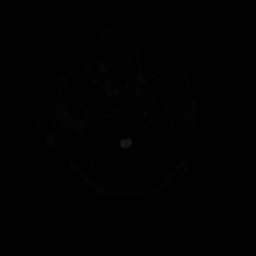
[im 14/98]
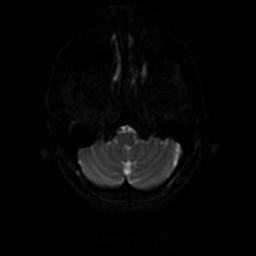
[im 28/98]
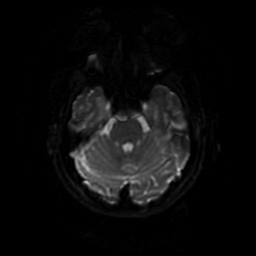
[im 42/98]
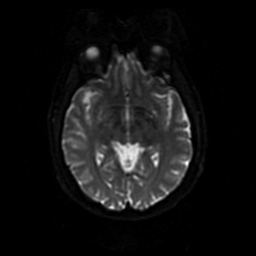
[im 56/98]
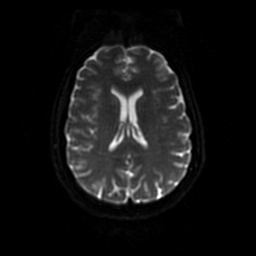
[im 70/98]
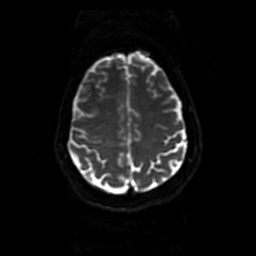
[im 84/98]
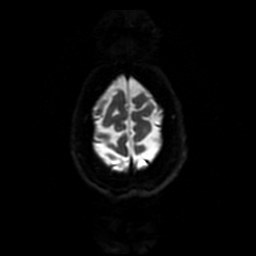
[im 98/98]
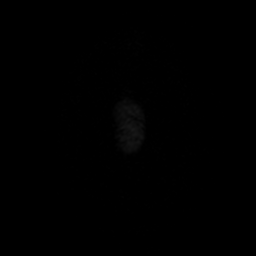

[Series 3: DWI · coronal · 4.0mm · 0.94mm/px · 5 of 68 slices shown (2 of 2)]
[im 1/68]
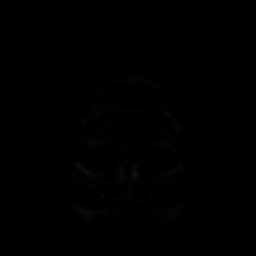
[im 17/68]
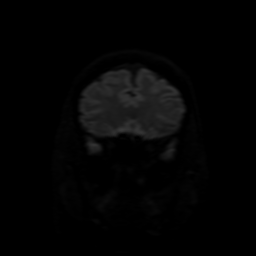
[im 34/68]
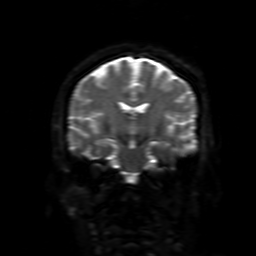
[im 51/68]
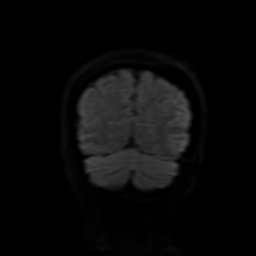
[im 68/68]
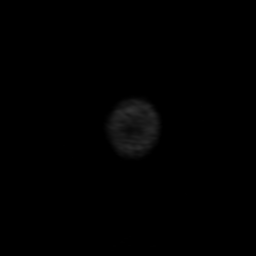

[Series 4: FLAIR · sagittal · 5.0mm · 0.23mm/px · 2 of 25 slices shown (1 of 2)]
[im 1/25]
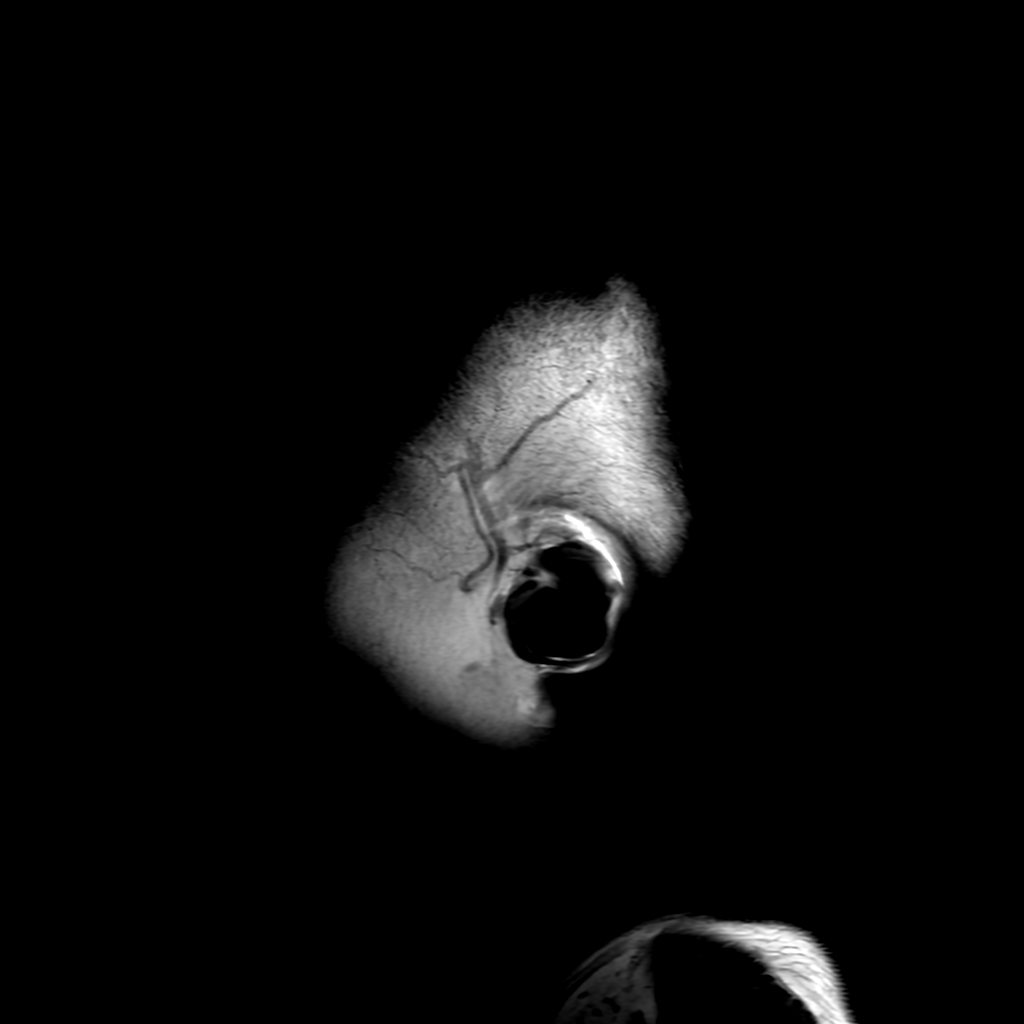
[im 25/25]
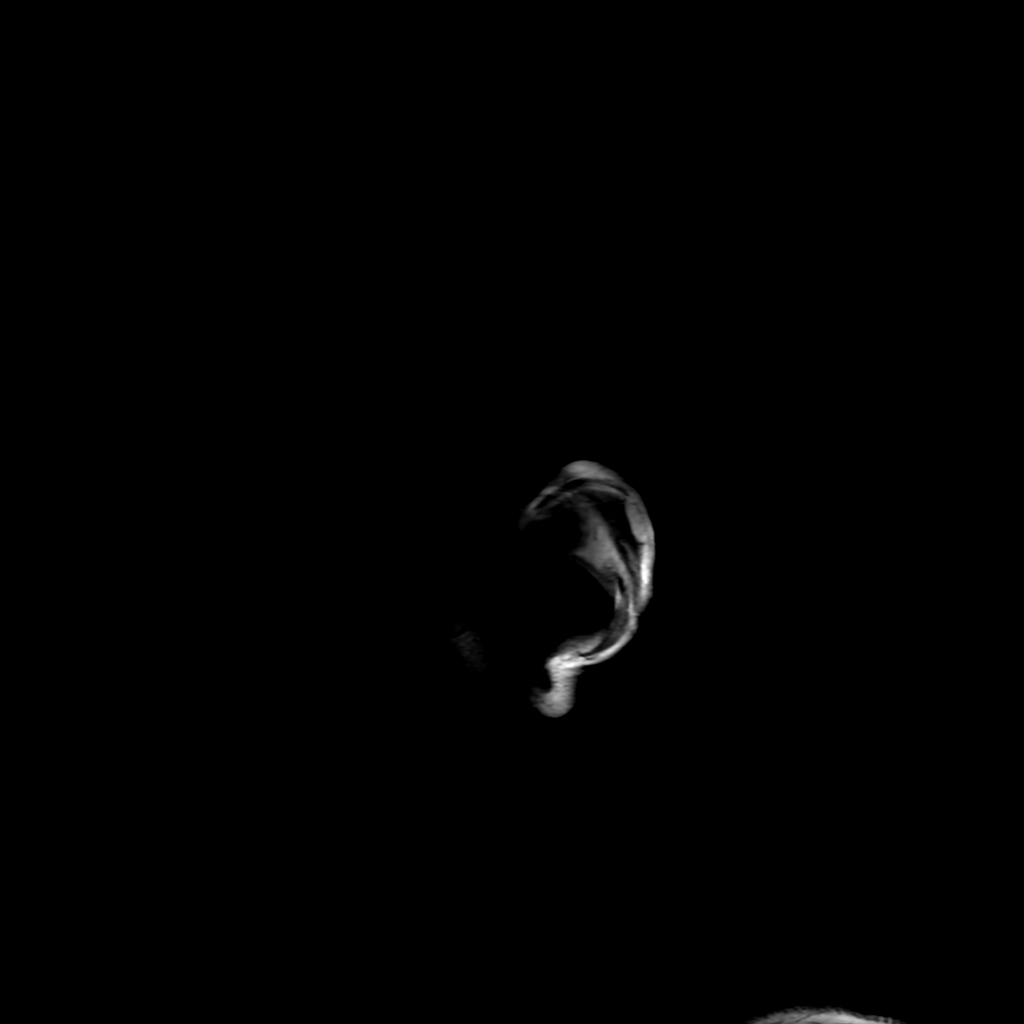

[Series 5: T2 · axial · 5.0mm · 0.23mm/px · 1 of 26 slices shown]
[im 1/26]
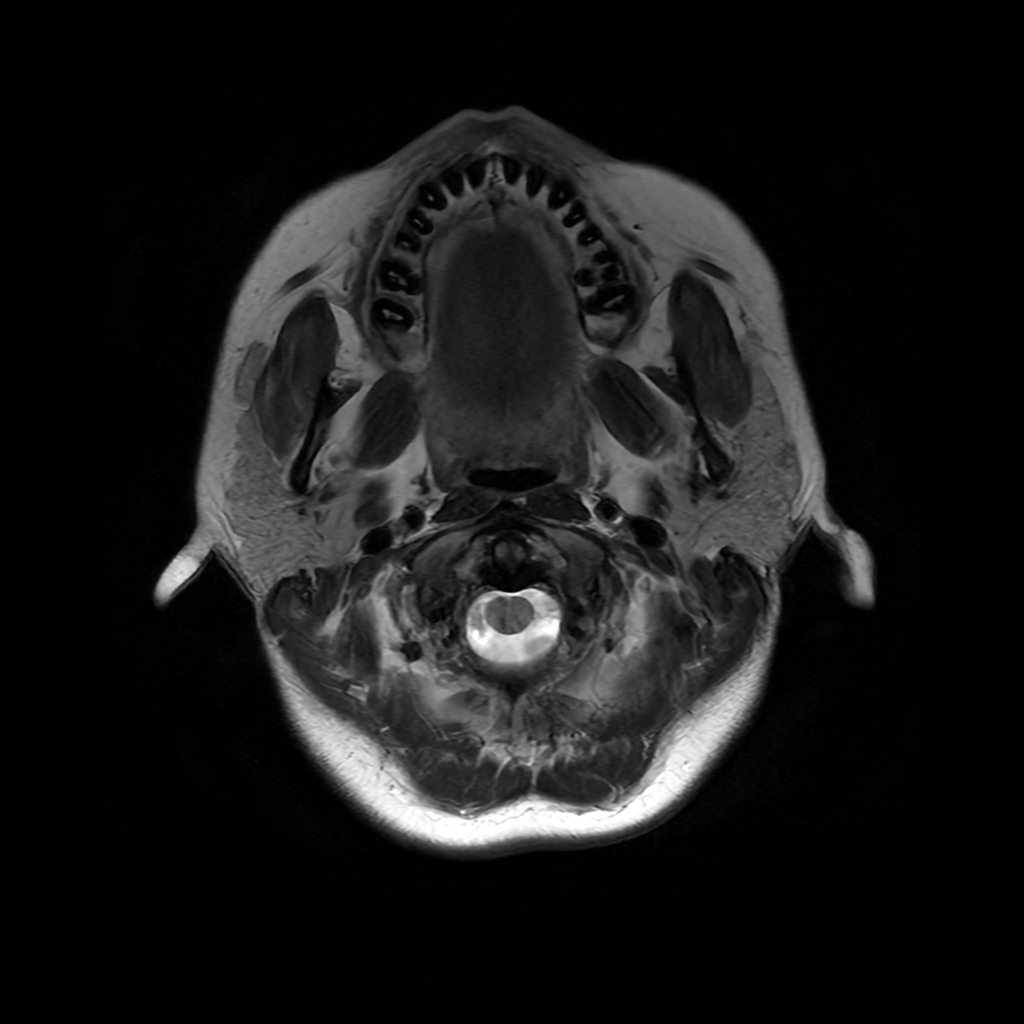

[Series 6: FLAIR · axial · 3.0mm · 0.45mm/px · z∈[-104,+29]mm · 2 of 25 slices shown (2 of 2)]
[im 1/25]
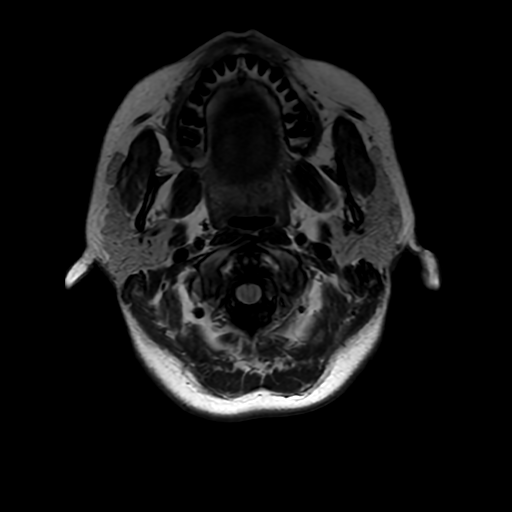
[im 25/25]
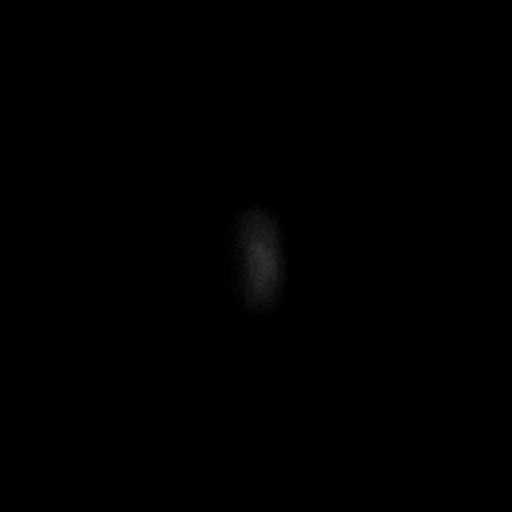

[Series 250: ADC · axial · 3.0mm · 0.94mm/px · z∈[-101,+33]mm · 4 of 49 slices shown (1 of 2)]
[im 1/49]
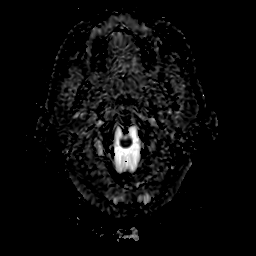
[im 17/49]
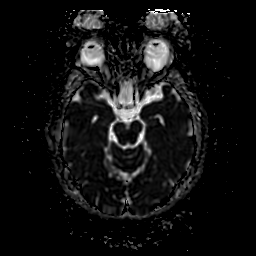
[im 33/49]
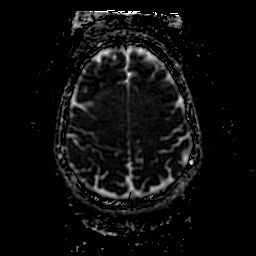
[im 49/49]
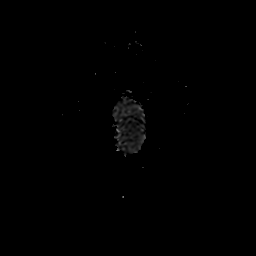

[Series 350: ADC · coronal · 4.0mm · 0.94mm/px · 3 of 34 slices shown (2 of 2)]
[im 1/34]
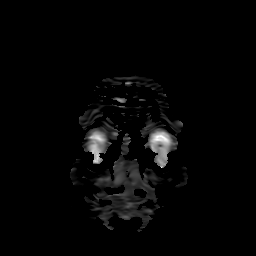
[im 17/34]
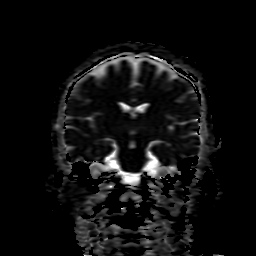
[im 34/34]
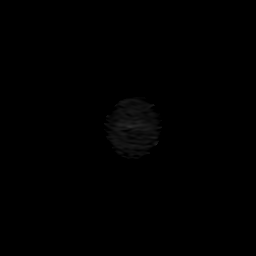

[25 of 48 positions shown; findings below may reference images not displayed]

FINDINGS: Brain: No acute infarction, hemorrhage, hydrocephalus, extra-axial
collection or mass lesion. Single T2/FLAIR hyperintense lesion
within the left frontal white matter, within normal limits for age
and most likely related to chronic microvascular ischemic disease.

Vascular: Major arterial flow voids are maintained at the skull
base.

Skull and upper cervical spine: Normal marrow signal.

Sinuses/Orbits: Inferior left maxillary sinus mucosal thickening and
mild scattered ethmoid air cell mucosal thickening. Otherwise, clear
sinuses without air-fluid levels. Unremarkable orbits.

Other: No mastoid effusions.
IMPRESSION: No evidence of acute abnormality.  No acute infarct.

## 2021-03-17 IMAGING — US US ART/VEN ABD/PELV/SCROTUM DOPPLER LTD
1 series · 14 of 25 positions shown · non-contrast
Comparison: None.

CLINICAL DATA: Heavy vaginal bleeding x1 day

EXAM:
TRANSABDOMINAL ULTRASOUND OF PELVIS
DOPPLER ULTRASOUND OF OVARIES
TECHNIQUE: Transabdominal ultrasound examination of the pelvis was performed
including evaluation of the uterus, ovaries, adnexal regions, and
pelvic cul-de-sac.
Color and duplex Doppler ultrasound was utilized to evaluate blood
flow to the ovaries.

[Series 1: us pelvic complete w transvaginal and torsion righ · 14 of 60 slices shown]
[im 1/60]
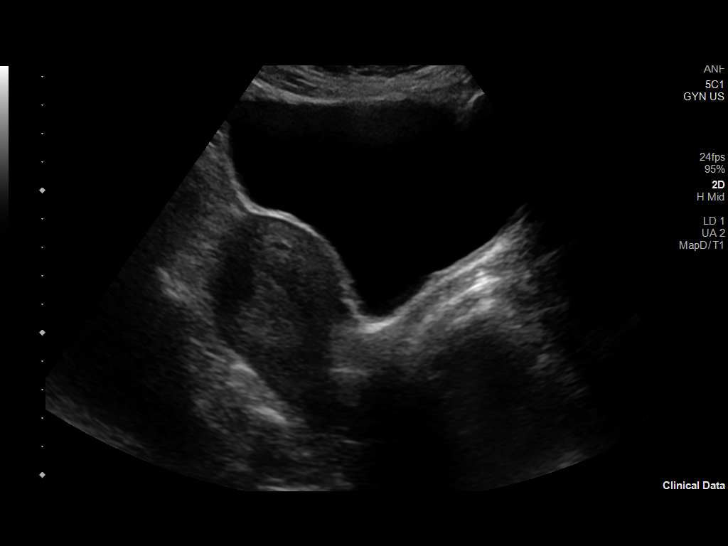
[im 5/60]
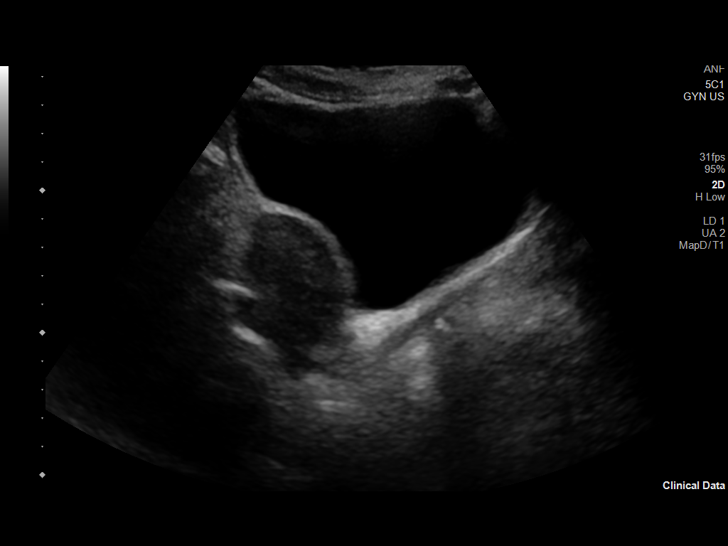
[im 10/60]
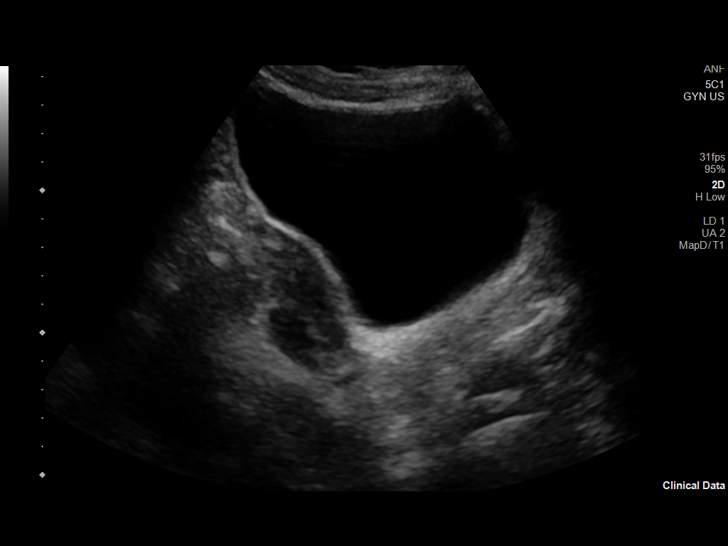
[im 15/60]
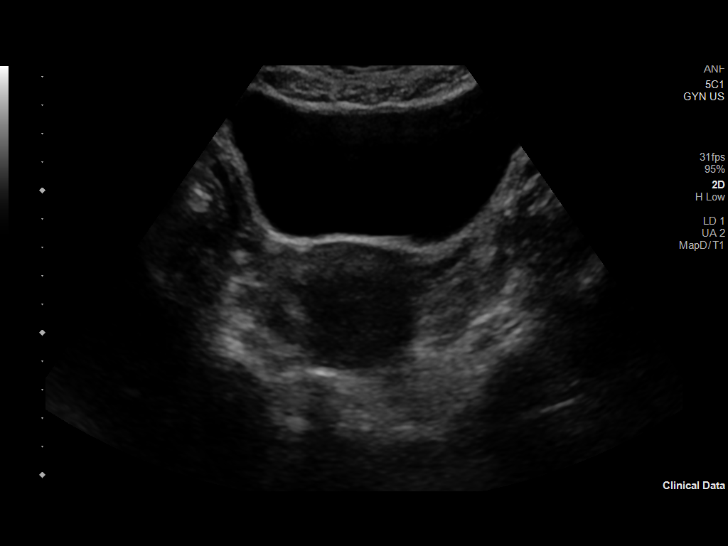
[im 20/60]
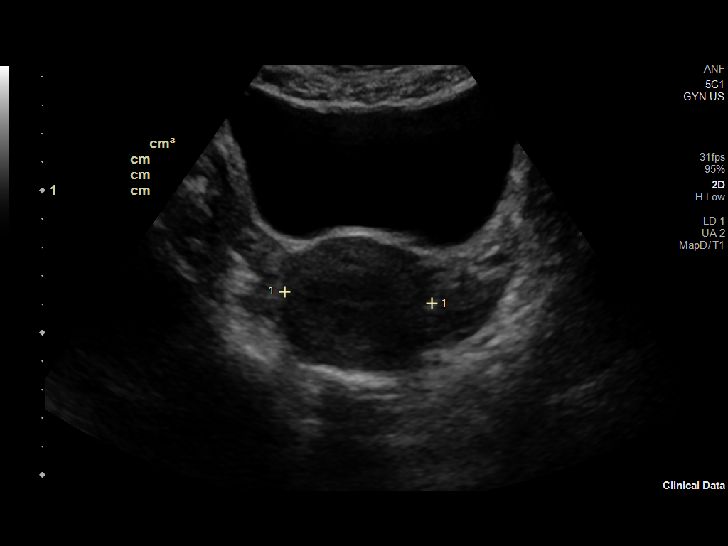
[im 23/60]
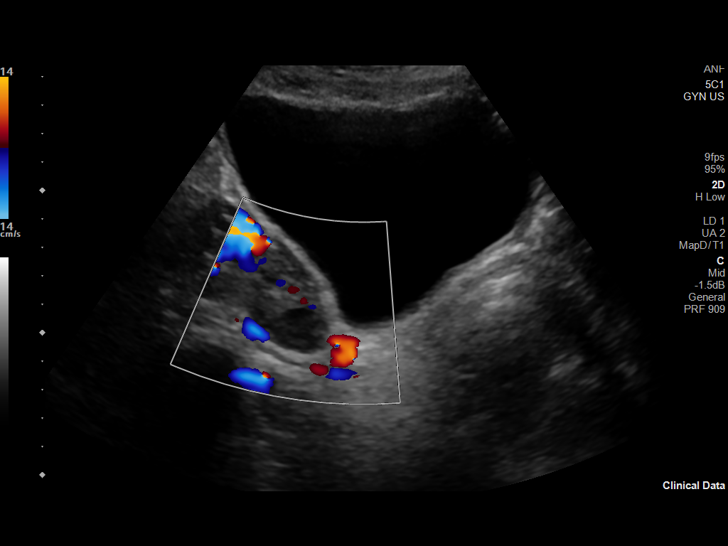
[im 28/60]
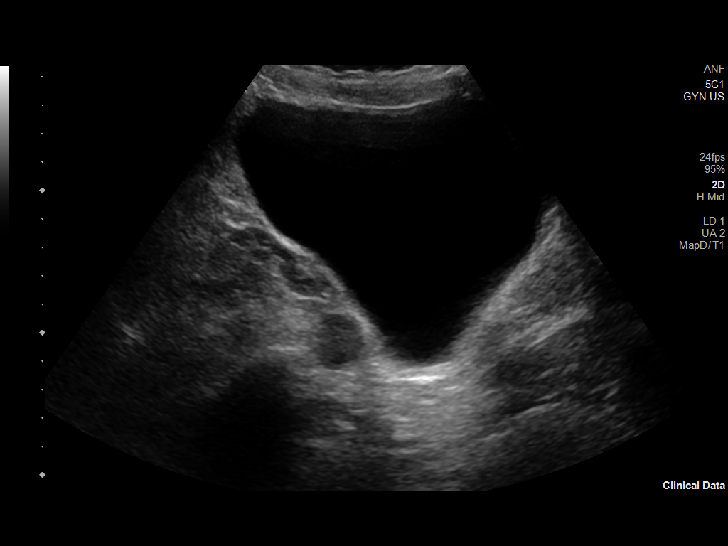
[im 32/60]
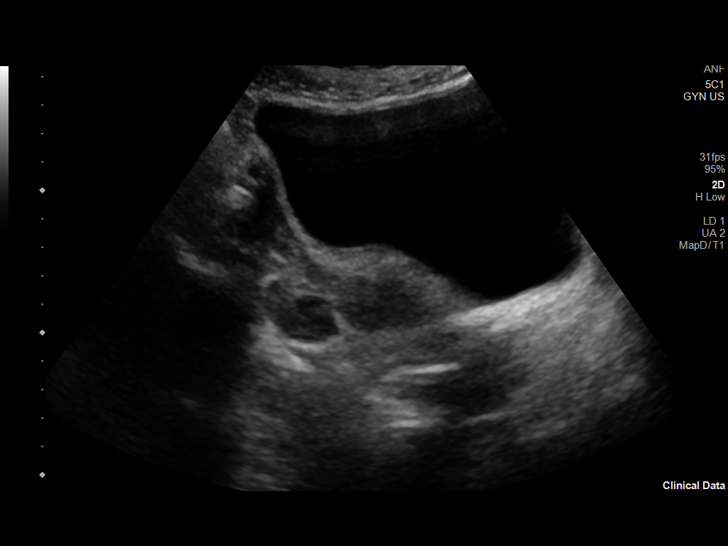
[im 37/60]
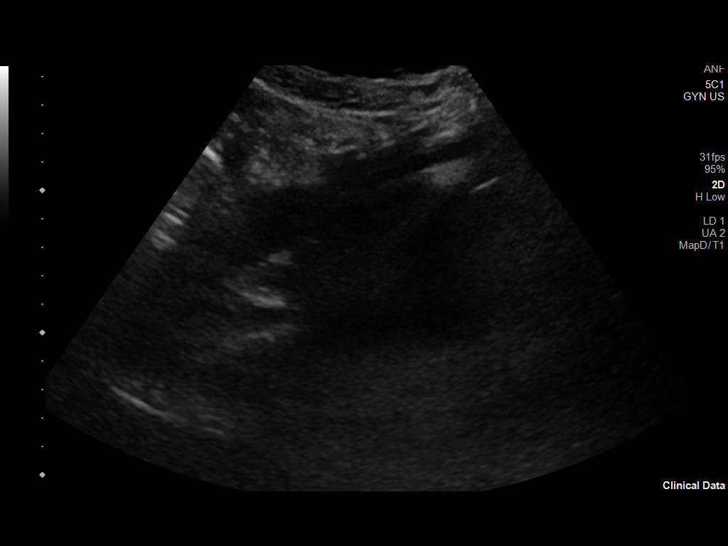
[im 40/60]
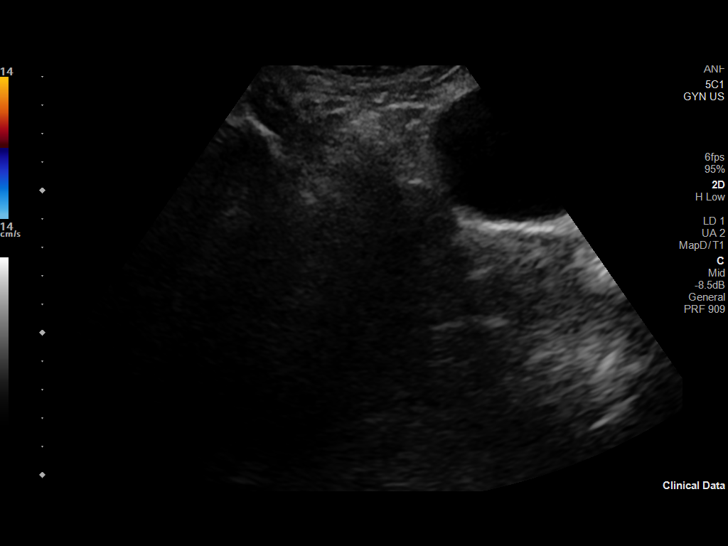
[im 45/60]
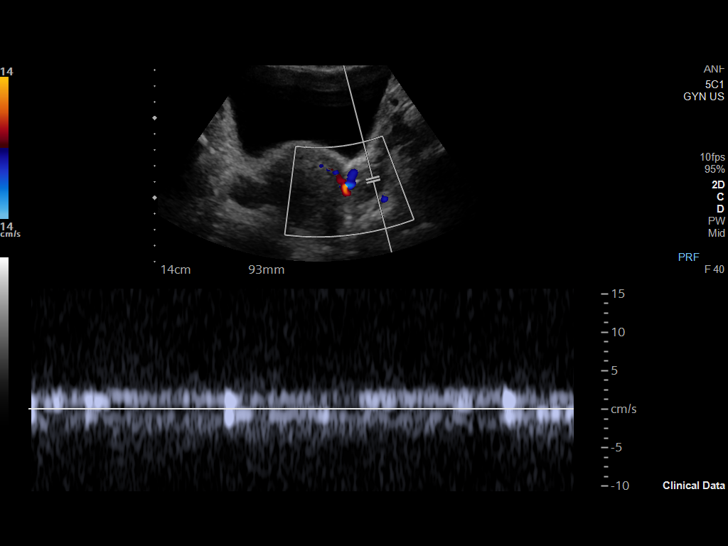
[im 50/60]
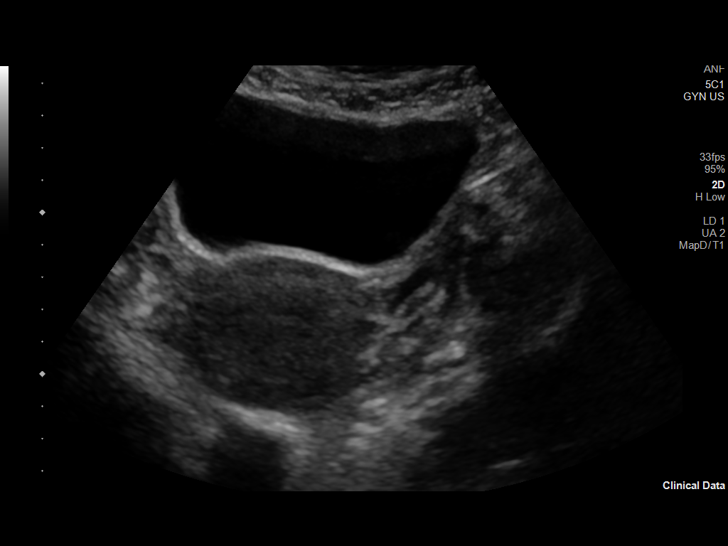
[im 55/60]
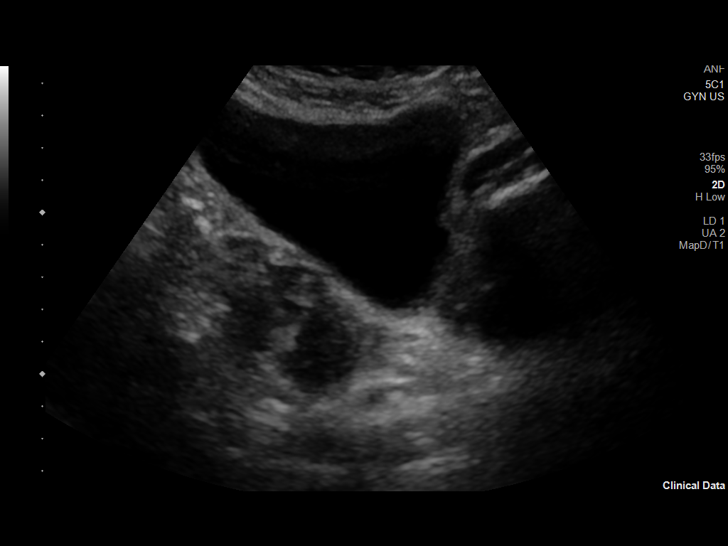
[im 60/60]
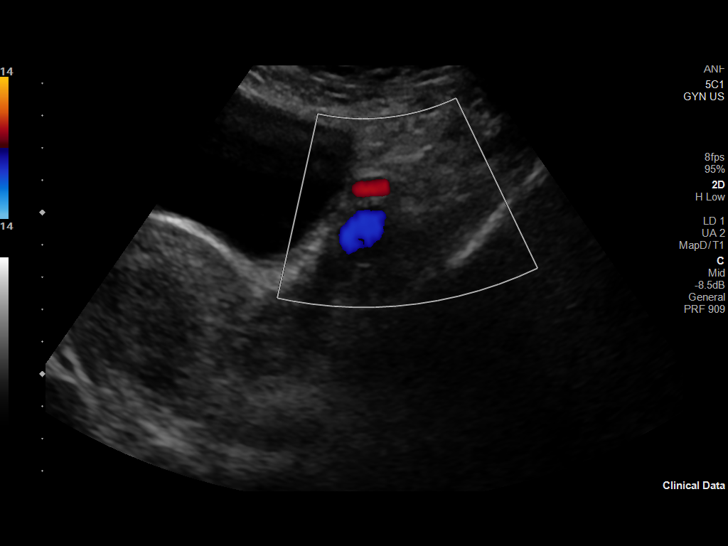

[14 of 25 positions shown; findings below may reference images not displayed]

FINDINGS: Uterus

Measurements: 9.2 x 5.2 x 4.5 cm = volume: 114 mL. No fibroids or
other mass visualized.

Endometrium

Thickness: 5 mm.  No focal abnormality visualized.

Right ovary

Measurements: 5.0 x 2.8 x 2.6 cm = volume: 19 mL. 2.2 cm dominant
follicle.

Left ovary

Measurements: 4.6 x 2.4 x 2.1 cm = volume: 12 mL. Normal
appearance/no adnexal mass.

Pulsed Doppler evaluation demonstrates normal low-resistance
arterial and venous waveforms in both ovaries.

Other: None
IMPRESSION: Unremarkable ultrasound of the pelvis. No evidence of ovarian
torsion.

## 2021-04-11 ENCOUNTER — Ambulatory Visit: Payer: Self-pay | Admitting: Internal Medicine

## 2021-07-04 ENCOUNTER — Other Ambulatory Visit: Payer: Self-pay | Admitting: Primary Care

## 2021-07-04 DIAGNOSIS — N6321 Unspecified lump in the left breast, upper outer quadrant: Secondary | ICD-10-CM

## 2021-07-04 DIAGNOSIS — N644 Mastodynia: Secondary | ICD-10-CM

## 2022-08-20 ENCOUNTER — Other Ambulatory Visit: Payer: Self-pay

## 2022-08-20 ENCOUNTER — Emergency Department: Payer: 59

## 2022-08-20 ENCOUNTER — Inpatient Hospital Stay: Payer: 59

## 2022-08-20 ENCOUNTER — Observation Stay
Admission: EM | Admit: 2022-08-20 | Discharge: 2022-08-21 | Disposition: A | Discharge status: Home / Self Care | Payer: 59 | Attending: Internal Medicine | Admitting: Internal Medicine

## 2022-08-20 ENCOUNTER — Inpatient Hospital Stay: Admit: 2022-08-20 | Payer: 59

## 2022-08-20 DIAGNOSIS — F419 Anxiety disorder, unspecified: Secondary | ICD-10-CM | POA: Diagnosis not present

## 2022-08-20 DIAGNOSIS — I471 Supraventricular tachycardia: Secondary | ICD-10-CM | POA: Diagnosis present

## 2022-08-20 DIAGNOSIS — I10 Essential (primary) hypertension: Secondary | ICD-10-CM | POA: Diagnosis present

## 2022-08-20 DIAGNOSIS — Z79899 Other long term (current) drug therapy: Secondary | ICD-10-CM

## 2022-08-20 DIAGNOSIS — R079 Chest pain, unspecified: Secondary | ICD-10-CM | POA: Diagnosis present

## 2022-08-20 DIAGNOSIS — G459 Transient cerebral ischemic attack, unspecified: Principal | ICD-10-CM | POA: Diagnosis present

## 2022-08-20 DIAGNOSIS — H538 Other visual disturbances: Secondary | ICD-10-CM | POA: Diagnosis present

## 2022-08-20 DIAGNOSIS — E663 Overweight: Secondary | ICD-10-CM | POA: Diagnosis not present

## 2022-08-20 DIAGNOSIS — Z8673 Personal history of transient ischemic attack (TIA), and cerebral infarction without residual deficits: Secondary | ICD-10-CM

## 2022-08-20 DIAGNOSIS — E785 Hyperlipidemia, unspecified: Secondary | ICD-10-CM | POA: Diagnosis not present

## 2022-08-20 DIAGNOSIS — M25552 Pain in left hip: Secondary | ICD-10-CM | POA: Diagnosis present

## 2022-08-20 DIAGNOSIS — G43009 Migraine without aura, not intractable, without status migrainosus: Secondary | ICD-10-CM | POA: Diagnosis not present

## 2022-08-20 DIAGNOSIS — R2990 Unspecified symptoms and signs involving the nervous system: Secondary | ICD-10-CM | POA: Diagnosis not present

## 2022-08-20 DIAGNOSIS — R002 Palpitations: Secondary | ICD-10-CM

## 2022-08-20 DIAGNOSIS — Z833 Family history of diabetes mellitus: Secondary | ICD-10-CM

## 2022-08-20 DIAGNOSIS — M25561 Pain in right knee: Secondary | ICD-10-CM | POA: Diagnosis present

## 2022-08-20 DIAGNOSIS — Z20822 Contact with and (suspected) exposure to covid-19: Secondary | ICD-10-CM | POA: Diagnosis not present

## 2022-08-20 DIAGNOSIS — R42 Dizziness and giddiness: Secondary | ICD-10-CM | POA: Diagnosis present

## 2022-08-20 DIAGNOSIS — Z635 Disruption of family by separation and divorce: Secondary | ICD-10-CM | POA: Diagnosis not present

## 2022-08-20 DIAGNOSIS — R0789 Other chest pain: Secondary | ICD-10-CM | POA: Diagnosis present

## 2022-08-20 DIAGNOSIS — I493 Ventricular premature depolarization: Secondary | ICD-10-CM | POA: Diagnosis present

## 2022-08-20 DIAGNOSIS — G8929 Other chronic pain: Secondary | ICD-10-CM | POA: Diagnosis present

## 2022-08-20 DIAGNOSIS — Z8616 Personal history of COVID-19: Secondary | ICD-10-CM | POA: Diagnosis not present

## 2022-08-20 DIAGNOSIS — R200 Anesthesia of skin: Secondary | ICD-10-CM | POA: Diagnosis not present

## 2022-08-20 DIAGNOSIS — R202 Paresthesia of skin: Secondary | ICD-10-CM | POA: Diagnosis not present

## 2022-08-20 LAB — URINALYSIS, ROUTINE W REFLEX MICROSCOPIC
Bilirubin Urine: NEGATIVE
Glucose, UA: NEGATIVE mg/dL
Hgb urine dipstick: NEGATIVE
Ketones, ur: NEGATIVE mg/dL
Leukocytes,Ua: NEGATIVE
Nitrite: NEGATIVE
Protein, ur: NEGATIVE mg/dL
Specific Gravity, Urine: >1.046 — ABNORMAL HIGH (ref 1.005–1.030)
pH: 7 (ref 5.0–8.0)

## 2022-08-20 LAB — DIFFERENTIAL
Abs Immature Granulocytes: 0.02 10*3/uL (ref 0.00–0.07)
Basophils Absolute: 0 10*3/uL (ref 0.0–0.1)
Basophils Relative: 0 %
Eosinophils Absolute: 0 10*3/uL (ref 0.0–0.5)
Eosinophils Relative: 1 %
Immature Granulocytes: 0 %
Lymphocytes Relative: 37 %
Lymphs Abs: 1.8 10*3/uL (ref 0.7–4.0)
Monocytes Absolute: 0.3 10*3/uL (ref 0.1–1.0)
Monocytes Relative: 6 %
Neutro Abs: 2.7 10*3/uL (ref 1.7–7.7)
Neutrophils Relative %: 56 %

## 2022-08-20 LAB — POC URINE PREG, ED: Preg Test, Ur: NEGATIVE

## 2022-08-20 LAB — URINE DRUG SCREEN, QUALITATIVE (ARMC ONLY)
Amphetamines, Ur Screen: NOT DETECTED
Barbiturates, Ur Screen: NOT DETECTED
Benzodiazepine, Ur Scrn: NOT DETECTED
Cannabinoid 50 Ng, Ur ~~LOC~~: NOT DETECTED
Cocaine Metabolite,Ur ~~LOC~~: NOT DETECTED
MDMA (Ecstasy)Ur Screen: NOT DETECTED
Methadone Scn, Ur: NOT DETECTED
Opiate, Ur Screen: NOT DETECTED
Phencyclidine (PCP) Ur S: NOT DETECTED
Tricyclic, Ur Screen: NOT DETECTED

## 2022-08-20 LAB — CBC
HCT: 35.2 % — ABNORMAL LOW (ref 36.0–46.0)
Hemoglobin: 11.7 g/dL — ABNORMAL LOW (ref 12.0–15.0)
MCH: 29.8 pg (ref 26.0–34.0)
MCHC: 33.2 g/dL (ref 30.0–36.0)
MCV: 89.8 fL (ref 80.0–100.0)
Platelets: 281 10*3/uL (ref 150–400)
RBC: 3.92 MIL/uL (ref 3.87–5.11)
RDW: 13.1 % (ref 11.5–15.5)
WBC: 4.9 10*3/uL (ref 4.0–10.5)
nRBC: 0 % (ref 0.0–0.2)

## 2022-08-20 LAB — COMPREHENSIVE METABOLIC PANEL
ALT: 18 U/L (ref 0–44)
AST: 19 U/L (ref 15–41)
Albumin: 4.4 g/dL (ref 3.5–5.0)
Alkaline Phosphatase: 47 U/L (ref 38–126)
Anion gap: 9 (ref 5–15)
BUN: 13 mg/dL (ref 6–20)
CO2: 23 mmol/L (ref 22–32)
Calcium: 8.8 mg/dL — ABNORMAL LOW (ref 8.9–10.3)
Chloride: 104 mmol/L (ref 98–111)
Creatinine, Ser: 0.97 mg/dL (ref 0.44–1.00)
GFR, Estimated: >60 mL/min (ref >60)
Glucose, Bld: 111 mg/dL — ABNORMAL HIGH (ref 70–99)
Potassium: 3.8 mmol/L (ref 3.5–5.1)
Sodium: 136 mmol/L (ref 135–145)
Total Bilirubin: 0.7 mg/dL (ref 0.3–1.2)
Total Protein: 7.4 g/dL (ref 6.5–8.1)

## 2022-08-20 LAB — C-REACTIVE PROTEIN: CRP: <0.5 mg/dL (ref <1.0)

## 2022-08-20 LAB — PROTIME-INR
INR: 1 (ref 0.8–1.2)
Prothrombin Time: 13 seconds (ref 11.4–15.2)

## 2022-08-20 LAB — APTT: aPTT: 31 seconds (ref 24–36)

## 2022-08-20 LAB — TROPONIN I (HIGH SENSITIVITY)
Troponin I (High Sensitivity): <2 ng/L (ref <18)
Troponin I (High Sensitivity): <2 ng/L (ref <18)

## 2022-08-20 LAB — HEMOGLOBIN A1C
Hgb A1c MFr Bld: 5.1 % (ref 4.8–5.6)
Mean Plasma Glucose: 99.67 mg/dL

## 2022-08-20 LAB — RESP PANEL BY RT-PCR (FLU A&B, COVID) ARPGX2
Influenza A by PCR: NEGATIVE
Influenza B by PCR: NEGATIVE
SARS Coronavirus 2 by RT PCR: NEGATIVE

## 2022-08-20 LAB — CBG MONITORING, ED: Glucose-Capillary: 149 mg/dL — ABNORMAL HIGH (ref 70–99)

## 2022-08-20 LAB — HIV ANTIBODY (ROUTINE TESTING W REFLEX): HIV Screen 4th Generation wRfx: NONREACTIVE

## 2022-08-20 LAB — SEDIMENTATION RATE: Sed Rate: 3 mm/hr (ref 0–20)

## 2022-08-20 LAB — ETHANOL: Alcohol, Ethyl (B): <10 mg/dL (ref <10)

## 2022-08-20 MED ORDER — CLOPIDOGREL BISULFATE 75 MG PO TABS
75.0000 mg | ORAL_TABLET | Freq: Every day | ORAL | Status: DC
  Administered 2022-08-20 – 2022-08-21 (×2): 75 mg via ORAL
  Filled 2022-08-20 (×2): qty 1

## 2022-08-20 MED ORDER — ASPIRIN 81 MG PO CHEW
81.0000 mg | CHEWABLE_TABLET | Freq: Every day | ORAL | Status: DC
  Administered 2022-08-21: 81 mg via ORAL
  Filled 2022-08-20: qty 1

## 2022-08-20 MED ORDER — ATORVASTATIN CALCIUM 20 MG PO TABS
80.0000 mg | ORAL_TABLET | Freq: Every day | ORAL | Status: DC
  Administered 2022-08-20 – 2022-08-21 (×2): 80 mg via ORAL
  Filled 2022-08-20 (×2): qty 4

## 2022-08-20 MED ORDER — ACETAMINOPHEN 500 MG PO TABS: 1000.0000 mg | ORAL_TABLET | Freq: Once | ORAL | Status: DC

## 2022-08-20 MED ORDER — GADOPICLENOL 0.5 MMOL/ML IV SOLN
6.0000 mL | Freq: Once | INTRAVENOUS | Status: AC | PRN
  Administered 2022-08-20: 6 mL via INTRAVENOUS

## 2022-08-20 MED ORDER — IOHEXOL 350 MG/ML SOLN
150.0000 mL | Freq: Once | INTRAVENOUS | Status: AC | PRN
  Administered 2022-08-20: 150 mL via INTRAVENOUS

## 2022-08-20 MED ORDER — ASPIRIN 325 MG PO TABS
325.0000 mg | ORAL_TABLET | Freq: Once | ORAL | Status: AC
  Administered 2022-08-20: 325 mg via ORAL
  Filled 2022-08-20: qty 1

## 2022-08-20 MED ORDER — SENNOSIDES-DOCUSATE SODIUM 8.6-50 MG PO TABS: 1.0000 | ORAL_TABLET | Freq: Every evening | ORAL | Status: DC | PRN

## 2022-08-20 MED ORDER — ACETAMINOPHEN 325 MG PO TABS: 650.0000 mg | ORAL_TABLET | ORAL | Status: DC | PRN

## 2022-08-20 MED ORDER — ACETAMINOPHEN 650 MG RE SUPP: 650.0000 mg | RECTAL | Status: DC | PRN

## 2022-08-20 MED ORDER — STROKE: EARLY STAGES OF RECOVERY BOOK: Freq: Once | Status: DC

## 2022-08-20 MED ORDER — HEPARIN SODIUM (PORCINE) 5000 UNIT/ML IJ SOLN
5000.0000 [IU] | Freq: Three times a day (TID) | INTRAMUSCULAR | Status: DC
  Administered 2022-08-21: 5000 [IU] via SUBCUTANEOUS
  Filled 2022-08-20 (×2): qty 1

## 2022-08-20 MED ORDER — SODIUM CHLORIDE 0.9 % IV SOLN: INTRAVENOUS | Status: AC

## 2022-08-20 MED ORDER — ACETAMINOPHEN 160 MG/5ML PO SOLN: 650.0000 mg | ORAL | Status: DC | PRN

## 2022-08-20 NOTE — ED Notes (Signed)
Patient to CT with this RN.

## 2022-08-20 NOTE — H&P (Addendum)
History and Physical    Melinda Morris WUJ:[id number] DOB: [birth year] DOA: 08/20/2022  PCP: Sandrea Hughsubio, Jessica, NP  Patient coming from: home I have personally briefly reviewed patient's [facility]  Chief Complaint: numbness left side, HA, Chest pain   HPI: Melinda Morris is a 41 y.o. 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 Patient also notes associated blurry vision in left eye as well as nausea. Of note patient has similar episode with admission 2021 at that time work CTH/CTA/MRI was negative and patient was discharged on ASA with diagnosis of TIA.  Patient currently states she feels much improved, notes she still feels some numbness. She denies any weakness, sob, cough.  She notes she had covid close to two weeks ago and note it was mild and her covid symptoms have resolved.  ON further ros she notes right knee pain for which she follow with her pcp and has pending mri. She also note left hip pain in the last few weeks as well.    ED Course:  Vitals: Afeb, bp 119/80, hr 62, rr 16  sat 975 on ra Labs  Glucose 149  Ce <2 Wbc 4.9, hgb 11.7 ( 12.2), plt 281 NA 136, K 3.8, glu 11, cr 0.97,  Etoh <10 CTH: NAD CTA chest: Normal contour and caliber of the thoracic and abdominal aorta. No evidence of aneurysm, dissection, or other acute aortic pathology. No significant atherosclerosis.  Respiratory panel Neg  Cxr:NAD  Tx asa, plavix ,statin   Review of Systems: As per HPI otherwise 10 point review of systems negative.   Past Medical History:  Diagnosis Date   Hypertension    Irregular heart beat     Past Surgical History:  Procedure Laterality Date   SHOULDER SURGERY Left       reports that she has never smoked. She has never used smokeless tobacco. She reports current alcohol use. She reports that she does not currently use drugs.  States she drinks beer occasionally  No Known Allergies  FH Maternal grandmother , MI in her 3240's Mother DMII   Prior to Admission medications   Medication Sig Start Date End Date Taking? Authorizing Provider  atorvastatin (LIPITOR) 80 MG tablet Take 1 tablet (80 mg total) by mouth daily for 15 days. 12/28/20 01/12/21  Khatri, Hina, PA-C  cyclobenzaprine (FLEXERIL) 10 MG tablet Take 1 tablet by mouth 3 (three) times daily as needed. 08/08/22   [provider]  DULoxetine (CYMBALTA) 30 MG capsule Take 2 capsules by mouth in the morning and at bedtime. 05/07/22   [provider]  metoprolol succinate (TOPROL-XL) 25 MG 24 hr tablet Take 25 mg by mouth daily. 05/01/22   [provider]  naproxen (NAPROSYN) 500 MG tablet Take 1 tablet (500 mg total) by mouth 2 (two) times daily. 12/28/20   Dietrich PatesKhatri, Hina, PA-C    Physical Exam: Vitals:   08/20/22 0945 08/20/22 0947 08/20/22 1013 08/20/22 1030  BP:  119/80 137/88 120/75  Pulse:  62 66 61  Resp:  16 15 (!) 27  Temp:  98 F (36.7 C)    TempSrc:  Oral    SpO2:  97% 99% 100%  Weight: 66.4  kg     Height: 5\' 4"  (1.626 m)        Vitals:   08/20/22 0945 08/20/22 0947 08/20/22 1013 08/20/22 1030  BP:  119/80 137/88 120/75  Pulse:  62 66 61  Resp:  16 15 (!) 27  Temp:  98 F (36.7 C)    TempSrc:  Oral    SpO2:  97% 99% 100%  Weight: 66.4 kg     Height: 5\' 4"  (1.626 m)     Constitutional: NAD, calm, comfortable Eyes: PERRL, lids and conjunctivae normal ENMT: Mucous membranes are moist. Posterior pharynx clear of any exudate or lesions.Normal dentition.  Neck: normal, supple, no masses, no thyromegaly Respiratory: clear to auscultation bilaterally, no wheezing, no crackles. Normal respiratory effort. No accessory muscle use.  Cardiovascular: Regular rate and  rhythm, no murmurs / rubs / gallops. No extremity edema. 2+ pedal pulses.  Abdomen: no tenderness, no masses palpated. No hepatosplenomegaly. Bowel sounds positive.  Musculoskeletal: no clubbing / cyanosis. No joint deformity upper and lower extremities. Good ROM, no contractures. Normal muscle tone.  Skin: no rashes, lesions, ulcers. No induration Neurologic: CN 2-12 grossly intact. Sensation intact, Strength 5/5 in all 4.  Psychiatric: Normal judgment and insight. Alert and oriented x 3. Normal mood.    Labs on Admission: I have personally reviewed following labs and imaging studies  CBC: Recent Labs  Lab 08/20/22 0951  WBC 4.9  NEUTROABS 2.7  HGB 11.7*  HCT 35.2*  MCV 89.8  PLT 818   Basic Metabolic Panel: Recent Labs  Lab 08/20/22 0951  NA 136  K 3.8  CL 104  CO2 23  GLUCOSE 111*  BUN 13  CREATININE 0.97  CALCIUM 8.8*   GFR: Estimated Creatinine Clearance: 70.1 mL/min (by C-G formula based on SCr of 0.97 mg/dL). Liver Function Tests: Recent Labs  Lab 08/20/22 0951  AST 19  ALT 18  ALKPHOS 47  BILITOT 0.7  PROT 7.4  ALBUMIN 4.4   No results for input(s): "LIPASE", "AMYLASE" in the last 168 hours. No results for input(s): "AMMONIA" in the last 168 hours. Coagulation Profile: Recent Labs  Lab 08/20/22 0951  INR 1.0   Cardiac Enzymes: No results for input(s): "CKTOTAL", "CKMB", "CKMBINDEX", "TROPONINI" in the last 168 hours. BNP (last 3 results) No results for input(s): "PROBNP" in the last 8760 hours. HbA1C: No results for input(s): "HGBA1C" in the last 72 hours. CBG: Recent Labs  Lab 08/20/22 0951  GLUCAP 149*   Lipid Profile: No results for input(s): "CHOL", "HDL", "LDLCALC", "TRIG", "CHOLHDL", "LDLDIRECT" in the last 72 hours. Thyroid Function Tests: No results for input(s): "TSH", "T4TOTAL", "FREET4", "T3FREE", "THYROIDAB" in the last 72 hours. Anemia Panel: No results for input(s): "VITAMINB12", "FOLATE", "FERRITIN", "TIBC", "IRON",  "RETICCTPCT" in the last 72 hours. Urine analysis:    Component Value Date/Time   COLORURINE STRAW (A) 12/28/2020 1527   APPEARANCEUR CLEAR 12/28/2020 1527   LABSPEC 1.004 (L) 12/28/2020 1527   PHURINE 8.0 12/28/2020 1527   GLUCOSEU NEGATIVE 12/28/2020 1527   HGBUR LARGE (A) 12/28/2020 1527   BILIRUBINUR NEGATIVE 12/28/2020 1527   KETONESUR NEGATIVE 12/28/2020 1527   PROTEINUR NEGATIVE 12/28/2020 1527   NITRITE NEGATIVE 12/28/2020 1527   LEUKOCYTESUR NEGATIVE 12/28/2020 1527    Radiological Exams on Admission: DG Chest 2 View  Result Date: 08/20/2022 CLINICAL DATA:  Chest pain and dizziness. EXAM: CHEST - 2 VIEW COMPARISON:  09/03/2014 FINDINGS: Heart size and mediastinal contours appear normal. No pleural effusion or edema. No airspace opacities  identified. Calcified granuloma noted in the left mid lung. Visualized osseous structures appear unremarkable. IMPRESSION: No acute cardiopulmonary abnormalities. Electronically Signed   By: Signa Kell M.D.   On: 08/20/2022 11:39   CT ANGIO HEAD NECK W WO CM (CODE STROKE)  Result Date: 08/20/2022 CLINICAL DATA:  Stroke suspected EXAM: CT ANGIOGRAPHY HEAD AND NECK TECHNIQUE: Multidetector CT imaging of the head and neck was performed using the standard protocol during bolus administration of intravenous contrast. Multiplanar CT image reconstructions and MIPs were obtained to evaluate the vascular anatomy. Carotid stenosis measurements (when applicable) are obtained utilizing NASCET criteria, using the distal internal carotid diameter as the denominator. RADIATION DOSE REDUCTION: This exam was performed according to the departmental dose-optimization program which includes automated exposure control, adjustment of the mA and/or kV according to patient size and/or use of iterative reconstruction technique. CONTRAST:  OMNIPAQUE IOHEXOL 350 MG/ML SOLN COMPARISON:  Prior CT head and neck angiogram 05/17/2020 FINDINGS: CT HEAD FINDINGS Brain: No  evidence of acute infarction, hemorrhage, hydrocephalus, extra-axial collection or mass lesion/mass effect. Vascular: See below Skull: Normal. Negative for fracture or focal lesion. Sinuses/Orbits: No acute finding. Other: None. Review of the MIP images confirms the above findings CTA NECK FINDINGS Aortic arch: 2 vessel aortic arch. Imaged portion shows no evidence of aneurysm or dissection. No significant stenosis of the major arch vessel origins. Right carotid system: No evidence of dissection, stenosis (50% or greater), or occlusion. Left carotid system: No evidence of dissection, stenosis (50% or greater), or occlusion. Vertebral arteries: Codominant. No evidence of dissection, stenosis (50% or greater), or occlusion. Skeleton: Negative. Other neck: Negative. Upper chest: See separately dictated CT chest abdomen and pelvis. Review of the MIP images confirms the above findings CTA HEAD FINDINGS Anterior circulation: No significant stenosis, proximal occlusion, aneurysm, or vascular malformation. Posterior circulation: No significant stenosis, proximal occlusion, aneurysm, or vascular malformation. Venous sinuses: As permitted by contrast timing, patent. Anatomic variants: Fetal type PCA on the right Review of the MIP images confirms the above findings IMPRESSION: 1. No acute intracranial abnormality. 2. No intracranial large vessel occlusion or significant stenosis. 3. No hemodynamically significant stenosis of the neck. Electronically Signed   By: Lorenza Cambridge M.D.   On: 08/20/2022 11:07   CT Angio Chest/Abd/Pel for Dissection W and/or W/WO  Result Date: 08/20/2022 CLINICAL DATA:  Acute aortic dissection suspected EXAM: CT ANGIOGRAPHY CHEST, ABDOMEN AND PELVIS TECHNIQUE: Non-contrast CT of the chest was initially obtained. Multidetector CT imaging through the chest, abdomen and pelvis was performed using the standard protocol during bolus administration of intravenous contrast. Multiplanar reconstructed  images and MIPs were obtained and reviewed to evaluate the vascular anatomy. RADIATION DOSE REDUCTION: This exam was performed according to the departmental dose-optimization program which includes automated exposure control, adjustment of the mA and/or kV according to patient size and/or use of iterative reconstruction technique. CONTRAST:  OMNIPAQUE IOHEXOL 350 MG/ML SOLN COMPARISON:  None Available. FINDINGS: CTA CHEST FINDINGS VASCULAR Aorta: Satisfactory opacification of the aorta. Normal contour and caliber of the thoracic aorta. No evidence of aneurysm, dissection, or other acute aortic pathology. Cardiovascular: No evidence of pulmonary embolism on limited non-tailored examination. Normal heart size. No pericardial effusion. Review of the MIP images confirms the above findings. NON VASCULAR Mediastinum/Nodes: No enlarged mediastinal, hilar, or axillary lymph nodes. Thyroid gland, trachea, and esophagus demonstrate no significant findings. Lungs/Pleura: Lungs are clear. Small, benign calcified nodule of the peripheral left upper lobe, for which no further follow-up or characterization is  required. No pleural effusion or pneumothorax. Musculoskeletal: No chest wall abnormality. No acute osseous findings. Review of the MIP images confirms the above findings. CTA ABDOMEN AND PELVIS FINDINGS VASCULAR Normal contour and caliber of the abdominal aorta. No evidence of aneurysm, dissection, or other acute aortic pathology. Standard branching pattern of the abdominal aorta with solitary bilateral renal arteries. Review of the MIP images confirms the above findings. NON-VASCULAR Hepatobiliary: No solid liver abnormality is seen. No gallstones, gallbladder wall thickening, or biliary dilatation. Pancreas: Unremarkable. No pancreatic ductal dilatation or surrounding inflammatory changes. Spleen: Normal in size without significant abnormality. Adrenals/Urinary Tract: Adrenal glands are unremarkable. Excreted  contrast in the renal collecting systems from previous contrast bolus. Kidneys are otherwise normal, without renal calculi, solid lesion, or hydronephrosis. Bladder is unremarkable. Stomach/Bowel: Stomach is within normal limits. Appendix appears normal. No evidence of bowel wall thickening, distention, or inflammatory changes. Lymphatic: No enlarged abdominal or pelvic lymph nodes. Reproductive: No mass or other significant abnormality. Other: No abdominal wall hernia or abnormality. No ascites. Musculoskeletal: No acute osseous findings. IMPRESSION: Normal contour and caliber of the thoracic and abdominal aorta. No evidence of aneurysm, dissection, or other acute aortic pathology. No significant atherosclerosis. Electronically Signed   By: Jearld Lesch M.D.   On: 08/20/2022 11:03   CT HEAD CODE STROKE WO CONTRAST  Result Date: 08/20/2022 CLINICAL DATA:  Code stroke.  Neuro deficit, acute, stroke suspected EXAM: CT HEAD WITHOUT CONTRAST TECHNIQUE: Contiguous axial images were obtained from the base of the skull through the vertex without intravenous contrast. RADIATION DOSE REDUCTION: This exam was performed according to the departmental dose-optimization program which includes automated exposure control, adjustment of the mA and/or kV according to patient size and/or use of iterative reconstruction technique. COMPARISON:  CT head December 28, 2020. FINDINGS: Brain: No evidence of acute large vascular territory infarction, hemorrhage, hydrocephalus, extra-axial collection or mass lesion/mass effect. Vascular: No hyperdense vessel identified. Skull: No acute fracture. Sinuses/Orbits: Largely clear sinuses.  No acute orbital findings. Other: No mastoid effusions. ASPECTS [facility] Stroke Program Early CT Score) total score (0-10 with 10 being normal): 10. IMPRESSION: 1. No evidence of acute intracranial abnormality. 2. ASPECTS is 10. Code stroke imaging results were communicated on 08/20/2022 at 10:09 am to  provider [facility] via telephone, who verbally acknowledged these results. Electronically Signed   By: Feliberto Harts M.D.   On: 08/20/2022 10:09    EKG: Independently reviewed. See above  Assessment/Plan  TIA r/o CVA -left sided numbness face, arm,leg, HA /chest pain  -- CODE stroke ED, seen by Dr Selina Cooley rec admit for stroke evaluation - CT head negative for acute finding ,  CTA neg negative for LVO,neuro exam no focal . NIHSS 1 due to decrease sensation on left side -admit tia/cva r/o  Per neurology recs: -MRI brain wwo (wwo 2/2 young age, r/o stroke and alt etiologies such as demyelinating disease) - MRV r/o venous sinus thrombosis causing headache - TTE w/ bubble - ASA 81mg  daily + plavix 75mg  daily x21 days f/b ASA 81mg  daily monotherapy after that - Atorvastatin 80mg  daily -neuro checks q4h , SLP, PT/OT    Atypical Chest pain - patient has history of this in the past along with palpitations/ Irregular HR due to PVC -was last seen by  [facility] cardiology 5 years ago,loss to follow up since then -holter monitor at that time  notes PVC's/PAC/ episode SVT -ETT stress test in 11/2014 was negative for ischemia -patient was started metoprolol at that time -resume  metoprolol as able  -currently, CE negative, EKG wnl , CTA negative of PE/ or Aortic dissection -pain currently improved  - continue to monitor symptoms -echo pending    HTN -holding oral medication due to plan for permissive HTN  48 hours s/p onset of sxs or until CVA ruled out  by MRI   HLD -on high dose statin  -check lipid panel   Right Knee pain -chronic  - follows with pcp  -mri pending for work up out patient  DVT prophylaxis:  scd/heparin Code Status: full Family Communication: none at bedside Disposition Plan: patient  expected to be admitted less than 2 midnights  Consults called:  Tele neuro /Dr Selina Cooley Admission status: progressive care    Lurline Del MD [facility]   If 7PM-7AM,  please contact night-coverage [email or web address] Password [facility]  08/20/2022, 12:32 PM

## 2022-08-20 NOTE — Progress Notes (Signed)
SLP Cancellation Note  Patient Details Name: Melinda Morris MRN: [id number] DOB: 1981/10/09   Cancelled treatment:       Reason Eval/Treat Not Completed: SLP screened, no needs identified, will sign off (chart reviewed; consulted NSG then met w/ pt in room. Just back from MRI.) Pt denied any difficulty swallowing and is currently on a regular diet - just arrived to room. Pt tolerates swallowing pills w/ water per NSG. Pt conversed in conversation w/out overt expressive/receptive deficits noted; pt denied any speech-language deficits. Speech clear. She had been texting on her phone denying any difficulty reading/sending texts. She was A/O x4; stated she had just returned from MRI. She endorsed she was "so hungry" and had not eaten today. She completed initial tray setup w/out difficulty. Offered a menu for later meals.  No further skilled ST services indicated as pt appears at her baseline. Pt agreed. NSG to reconsult if any change in status while admitted.       Orinda Kenner, MS, CCC-SLP Speech Language Pathologist Rehab Services; [place] [phone] (ascom) Willma Obando 08/20/2022, 3:12 PM

## 2022-08-20 NOTE — ED Notes (Addendum)
Pt went to bed at 2100.

## 2022-08-20 NOTE — Progress Notes (Signed)
CODE STROKE- PHARMACY COMMUNICATION   Time CODE STROKE called/page received:1001  Time response to CODE STROKE was made (in person or via phone): 1010  Time Stroke Kit retrieved from [place] (only if needed):N/A, outside of [facility] treatment window  Name of Provider/Nurse contacted:Colleen Quinn Axe, Neurologist   Past Medical History:  Diagnosis Date   Hypertension    Irregular heart beat    Prior to Admission medications   Medication Sig Start Date End Date Taking? Authorizing Provider  atorvastatin (LIPITOR) 80 MG tablet Take 1 tablet (80 mg total) by mouth daily for 15 days. 12/28/20 01/12/21  Khatri, Hina, PA-C  cyclobenzaprine (FLEXERIL) 10 MG tablet Take 1 tablet by mouth 3 (three) times daily as needed. 08/08/22   [provider]  DULoxetine (CYMBALTA) 30 MG capsule Take 2 capsules by mouth in the morning and at bedtime. 05/07/22   [provider]  metoprolol succinate (TOPROL-XL) 25 MG 24 hr tablet Take 25 mg by mouth daily. 05/01/22   [provider]  naproxen (NAPROSYN) 500 MG tablet Take 1 tablet (500 mg total) by mouth 2 (two) times daily. 12/28/20   Delia Heady, PA-C    Pearla Dubonnet ,PharmD Clinical Pharmacist  08/20/2022  12:25 PM

## 2022-08-20 NOTE — Evaluation (Signed)
Physical Therapy Evaluation Patient Details Name: Melinda Morris MRN: [id number] DOB: March 14, 1981 Today's Date: 08/20/2022  History of Present Illness  Melinda Morris is a 41 y.o. 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 Patient also notes associated blurry vision in left eye as well as nausea. Of note patient has similar episode with admission 2021 at that time work CTH/CTA/MRI was negative and patient was discharged on ASA with diagnosis of TIA.  Patient currently states she feels much improved, notes she still feels some numbness. She denies any weakness, sob, cough.  Clinical Impression  Patient received on stretcher. She is agreeable to PT assessment. Patient is independent with bed mobility and transfers. Supervision for ambulation 200 feet without AD. She ambulates with antalgic gait pattern due to chronic knee pain. She will benefit from PT follow up for L side numbness and right knee pain/altered gait pattern.       Recommendations for follow up therapy are one component of a multi-disciplinary discharge planning process, led by the attending physician.  Recommendations may be updated based on patient status, additional functional criteria and insurance authorization.  Follow Up Recommendations Outpatient PT      Assistance Recommended at Discharge PRN  Patient can return home with the following  Help with stairs or ramp for entrance    Equipment Recommendations Other (comment);None recommended by PT (TBD)  Recommendations for Other Services       Functional Status Assessment Patient has had a recent decline in their functional status and demonstrates the ability to make significant improvements in function in a  reasonable and predictable amount of time.     Precautions / Restrictions Precautions Precautions: Fall Restrictions Weight Bearing Restrictions: No      Mobility  Bed Mobility Overal bed mobility: Independent                  Transfers Overall transfer level: Independent Equipment used: None                    Ambulation/Gait Ambulation/Gait assistance: Supervision Gait Distance (Feet): 200 Feet Assistive device: None Gait Pattern/deviations: Step-to pattern, Decreased weight shift to right, Antalgic Gait velocity: slightly decreased     General Gait Details: painful right knee that is chronic. Otherwise WNL reports mild numbness on left side, no weakness  Stairs            Wheelchair Mobility    Modified Rankin (Stroke Patients Only)       Balance Overall balance assessment: Independent                                           Pertinent Vitals/Pain Pain Assessment Pain Assessment: Faces Faces Pain Scale: Hurts little more Pain Location: R knee Pain Descriptors / Indicators: Discomfort, Sore Pain Intervention(s): Monitored during session    Home Living Family/patient expects to be discharged to:: Private residence Living Arrangements: Spouse/significant other;Children Available Help at Discharge: Family;Available PRN/intermittently Type of Home: House Home Access: Level entry       Home Layout: One level Home Equipment: Cane - single point  Prior Function Prior Level of Function : Independent/Modified Independent             Mobility Comments: ambulates with cane occasionally due to R knee pain, states she works, drives ADLs Comments: independent     Hand Dominance   Dominant Hand: Right    Extremity/Trunk Assessment   Upper Extremity Assessment Upper Extremity Assessment: Defer to OT evaluation    Lower Extremity Assessment Lower Extremity Assessment: RLE deficits/detail RLE Deficits /  Details: painful-chronic RLE Coordination: decreased gross motor    Cervical / Trunk Assessment Cervical / Trunk Assessment: Normal  Communication   Communication: No difficulties  Cognition Arousal/Alertness: Awake/alert Behavior During Therapy: WFL for tasks assessed/performed Overall Cognitive Status: Within Functional Limits for tasks assessed                                          General Comments      Exercises     Assessment/Plan    PT Assessment Patient needs continued PT services  PT Problem List Decreased strength;Decreased mobility;Decreased activity tolerance;Pain;Decreased knowledge of use of DME       PT Treatment Interventions DME instruction;Gait training;Functional mobility training;Therapeutic activities;Patient/family education;Therapeutic exercise    PT Goals (Current goals can be found in the Care Plan section)  Acute Rehab PT Goals Patient Stated Goal: to feel better, go home PT Goal Formulation: With patient Time For Goal Achievement: 08/28/22 Potential to Achieve Goals: Good    Frequency Min 2X/week     Co-evaluation               AM-PAC PT "6 Clicks" Mobility  Outcome Measure Help needed turning from your back to your side while in a flat bed without using bedrails?: None Help needed moving from lying on your back to sitting on the side of a flat bed without using bedrails?: None Help needed moving to and from a bed to a chair (including a wheelchair)?: None Help needed standing up from a chair using your arms (e.g., wheelchair or bedside chair)?: None Help needed to walk in hospital room?: None Help needed climbing 3-5 steps with a railing? : A Little 6 Click Score: 23    End of Session   Activity Tolerance: Patient tolerated treatment well Patient left: in bed;with call bell/phone within reach;with family/visitor present Nurse Communication: Mobility status PT Visit Diagnosis: Muscle weakness (generalized)  (M62.81);Difficulty in walking, not elsewhere classified (R26.2);Pain Pain - Right/Left: Right Pain - part of body: Knee    Time: 0076-2263 PT Time Calculation (min) (ACUTE ONLY): 8 min   Charges:   PT Evaluation $PT Eval Low Complexity: 1 Low          Carina Chaplin, PT, GCS 08/20/22,3:35 PM

## 2022-08-20 NOTE — ED Provider Notes (Signed)
[facility] Provider Note    Event Date/Time   First MD Initiated Contact with Patient 08/20/22 1009     (approximate)   History   Chest Pain and Dizziness   HPI  Valen Allina Riches is a 41 y.o. female past medical history of palpitations who presents with chest pain headache and numbness.  Symptoms started around 530.  The chest pain started first it is a stabbing sensation that radiates to her back.  Has been constant.  No clear exacerbating or alleviating factors.  Patient also endorses a numb sensation on left side of her face left arm and left leg.  Denies associated weakness visual difficulty or aphasia.  Also has a headache.  Has had chest pain in the past.  Patient did have COVID last week.  And has seen cardiology in the past.  Had a Holter monitor for palpitations which showed PVCs and was started on a beta-blocker.  Also was documented that she had intermittent chest pain.  Had a stress test in 2016 that was negative.  TTE was also normal.     Past Medical History:  Diagnosis Date   Hypertension    Irregular heart beat     Patient Active Problem List   Diagnosis Date Noted   Numbness    Hyperlipidemia    Anxiety    TIA (transient ischemic attack) 05/17/2020   Hypertension      Physical Exam  Triage Vital Signs: ED Triage Vitals  Enc Vitals Group     BP 08/20/22 0947 119/80     Pulse Rate 08/20/22 0947 62     Resp 08/20/22 0947 16     Temp 08/20/22 0947 98 F (36.7 C)     Temp Source 08/20/22 0947 Oral     SpO2 08/20/22 0947 97 %     Weight 08/20/22 0945 146 lb 6.2 oz (66.4 kg)     Height 08/20/22 0945 5\' 4"  (1.626 m)     Head Circumference --      Peak Flow --      Pain Score 08/20/22 0944 8     Pain Loc --      Pain Edu? --      Excl. in GC? --     Most recent vital signs: Vitals:   08/20/22 1013 08/20/22 1030  BP: 137/88 120/75  Pulse: 66 61  Resp: 15 (!) 27  Temp:    SpO2: 99% 100%     General: Awake, no  distress.  CV:  Good peripheral perfusion.  Resp:  Normal effort.  Abd:  No distention.  Neuro:             Awake, Alert, Oriented x 3  Other:  Aox3, nml speech  PERRL, EOMI, face symmetric, nml tongue movement  5/5 strength in the BL upper and lower extremities  Subjective decrease sensation to light touch in the left side of face left arm and left lower extremity Finger-nose-finger intact BL    ED Results / Procedures / Treatments  Labs (all labs ordered are listed, but only abnormal results are displayed) Labs Reviewed  CBC - Abnormal; Notable for the following components:      Result Value   Hemoglobin 11.7 (*)    HCT 35.2 (*)    All other components within normal limits  COMPREHENSIVE METABOLIC PANEL - Abnormal; Notable for the following components:   Glucose, Bld 111 (*)    Calcium 8.8 (*)    All  other components within normal limits  CBG MONITORING, ED - Abnormal; Notable for the following components:   Glucose-Capillary 149 (*)    All other components within normal limits  RESP PANEL BY RT-PCR (FLU A&B, COVID) ARPGX2  ETHANOL  PROTIME-INR  APTT  DIFFERENTIAL  URINE DRUG SCREEN, QUALITATIVE ([facility] ONLY)  URINALYSIS, ROUTINE W REFLEX MICROSCOPIC  HEMOGLOBIN A1C  POC URINE PREG, ED  TROPONIN I (HIGH SENSITIVITY)  TROPONIN I (HIGH SENSITIVITY)     EKG  EKG interpretation performed by myself: NSR, nml axis, nml intervals, no acute ischemic changes    RADIOLOGY I reviewed and interpreted the CT scan of the brain which does not show any acute intracranial process    PROCEDURES:  Critical Care performed: No  Procedures  The patient is on the cardiac monitor to evaluate for evidence of arrhythmia and/or significant heart rate changes.   MEDICATIONS ORDERED IN ED: Medications   stroke: early stages of recovery book (has no administration in time range)  atorvastatin (LIPITOR) tablet 80 mg (has no administration in time range)  aspirin tablet 325 mg (has  no administration in time range)    Followed by  aspirin chewable tablet 81 mg (has no administration in time range)  clopidogrel (PLAVIX) tablet 75 mg (has no administration in time range)  acetaminophen (TYLENOL) tablet 1,000 mg (has no administration in time range)  iohexol (OMNIPAQUE) 350 MG/ML injection 150 mL (150 mLs Intravenous Contrast Given 08/20/22 1053)     IMPRESSION / MDM / [place] / ED COURSE  I reviewed the triage vital signs and the nursing notes.                              Patient's presentation is most consistent with acute presentation with potential threat to life or bodily function.  Differential diagnosis includes, but is not limited to, CVA, complicated migraine, cerebral venous sinus thrombosis, subarachnoid hemorrhage, aortic dissection  Patient is a 41 year old female who presents with left-sided numbness headache chest pain.  Symptoms also all started around 5:30 AM.  On arrival to the ED patient made a stroke alert given acute onset of numbness.  CT head done before patient taken to ED bed is negative for acute abnormality.  Patient's NIH stroke scale is 1 her only deficit is subjective decrease sensation on the entire left side of her body.  Patient is complaining of sharp chest pain radiating to the back.  While she has had this chest pain in the past but is not been this severe.  Also complains of headache.  All symptoms started around the same time.  EKG is nonischemic.  Troponin is negative.  Given her neurologic symptoms and chest pain we obtained a CT angio of the chest to rule out dissection and this is negative.  CT angio head and neck also was negative.  Patient's labs are reassuring.  BC and CMP essentially normal.  I discussed with Dr. Quinn Axe with neurology who recommends admission for full stroke work-up.  She is being given aspirin and Plavix.       FINAL CLINICAL IMPRESSION(S) / ED DIAGNOSES   Final diagnoses:  Numbness     Rx /  DC Orders   ED Discharge Orders     None        Note:  This document was prepared using Dragon voice recognition software and may include unintentional dictation errors.   Rada Hay, MD  08/20/22 1211  

## 2022-08-20 NOTE — ED Triage Notes (Signed)
Pt here with CP and dizziness that started this morning at 0400. Pt states she woke up and her chest began to hurt in the center and radiates to her back. Pt states she has numbness on the left side and vision blurriness in her left eye. Pt speaking in full sentences. Pt states some nausea as well.

## 2022-08-20 NOTE — ED Notes (Signed)
Patient back to room with this RN.

## 2022-08-20 NOTE — Evaluation (Addendum)
Occupational Therapy Evaluation Patient Details Name: Melinda Morris MRN: [id number] DOB: [birth year] Today's Date: 08/20/2022   History of Present Illness Melinda Morris is a 41 y.o. 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 Patient also notes associated blurry vision in left eye as well as nausea. Of note patient has similar episode with admission 2021 at that time work CTH/CTA/MRI was negative and patient was discharged on ASA with diagnosis of TIA.  Patient currently states she feels much improved, notes she still feels some numbness. She denies any weakness, sob, cough.   Clinical Impression   Pt agreeable to OT/PT co-evaluation to maximize safety and participation. Family present in room. Pt presenting with L side facial/UE numbness, decreased endurance, and R knee pain. At baseline, pt was independent in ADLs, IADLs, and functional mobility using a SPC. During evaluation, pt was independent for bed mobility, independent for functional transfers, and supervision for functional mobility. Family is able to provide physical assistance as needed upon discharge. Pt is currently independent for all self-care tasks, therefore, no acute skilled OT services are indicated at this time or upon D/C.   Recommendations for follow up therapy are one component of a multi-disciplinary discharge planning process, led by the attending physician.  Recommendations may be updated based on patient status, additional functional criteria and insurance authorization.   Follow Up Recommendations  No OT follow up    Assistance Recommended at Discharge PRN  Patient can return home with the following Help with stairs or ramp for entrance    Functional  Status Assessment  Patient has not had a recent decline in their functional status  Equipment Recommendations  None recommended by OT    Recommendations for Other Services       Precautions / Restrictions Precautions Precautions: Fall Restrictions Weight Bearing Restrictions: No      Mobility Bed Mobility Overal bed mobility: Independent                  Transfers Overall transfer level: Independent Equipment used: None                      Balance                                           ADL either performed or assessed with clinical judgement   ADL Overall ADL's : Independent                                             Vision Baseline Vision/History: 0 No visual deficits Patient Visual Report: No change from baseline Additional Comments: pt reported blurry vision has resolved     Perception     Praxis      Pertinent Vitals/Pain Pain Assessment Pain Assessment: Faces Faces Pain Scale: Hurts little more Pain Location: R knee Pain Descriptors / Indicators: Discomfort, Sore Pain Intervention(s): Monitored during session     Hand Dominance Right   Extremity/Trunk Assessment Upper Extremity Assessment  Upper Extremity Assessment: LUE deficits/detail (AROM, MMT, and coordination WFL for BUEs.) LUE Deficits / Details: Pt reports numbness on L side of face and arms.   Lower Extremity Assessment Lower Extremity Assessment: RLE deficits/detail RLE Deficits / Details: painful-chronic RLE Coordination: decreased gross motor   Cervical / Trunk Assessment Cervical / Trunk Assessment: Normal   Communication Communication Communication: No difficulties   Cognition Arousal/Alertness: Awake/alert Behavior During Therapy: WFL for tasks assessed/performed Overall Cognitive Status: Within Functional Limits for tasks assessed                                       General Comments        Exercises Other Exercises Other Exercises: OT provided education re: role of OT, OT POC, post acute recs, sitting up for all meals, EOB/OOB mobility with assistance, home/fall safety.     Shoulder Instructions      Home Living Family/patient expects to be discharged to:: Private residence Living Arrangements: Spouse/significant other;Children Available Help at Discharge: Family;Available PRN/intermittently Type of Home: House Home Access: Level entry     Home Layout: One level               Home Equipment: Cane - single point          Prior Functioning/Environment Prior Level of Function : Independent/Modified Independent;Driving;Working/employed             Mobility Comments: ambulates with cane occasionally due to R knee pain, states she works, drives ADLs Comments: independent        OT Problem List: Impaired sensation;Pain;Decreased activity tolerance      OT Treatment/Interventions:      OT Goals(Current goals can be found in the care plan section) Acute Rehab OT Goals Patient Stated Goal: to go home OT Goal Formulation: All assessment and education complete, DC therapy  OT Frequency:      Co-evaluation              AM-PAC OT "6 Clicks" Daily Activity     Outcome Measure Help from another person eating meals?: None Help from another person taking care of personal grooming?: None Help from another person toileting, which includes using toliet, bedpan, or urinal?: None Help from another person bathing (including washing, rinsing, drying)?: None Help from another person to put on and taking off regular upper body clothing?: None Help from another person to put on and taking off regular lower body clothing?: None 6 Click Score: 24   End of Session Nurse Communication: Mobility status  Activity Tolerance: Patient tolerated treatment well Patient left: in bed;with call bell/phone within reach;with family/visitor present  OT Visit Diagnosis:  Other abnormalities of gait and mobility (R26.89)                Time: 8185-6314 OT Time Calculation (min): 8 min Charges:  OT General Charges $OT Visit: 1 Visit OT Evaluation $OT Eval Low Complexity: 1 Low  [facility] MS, OTR/L ascom [phone]  08/20/22, 5:23 PM

## 2022-08-20 NOTE — Consult Note (Signed)
NEUROLOGY CONSULTATION NOTE   Date of service: August 20, 2022 Patient Name: Melinda Morris MRN:  [id number] DOB:  [birth year] Reason for consult: stroke code Requesting physician: Dr. Acquanetta Belling _ _ _   _ __   _ __ _ _  __ __   _ __   __ _  History of Present Illness   This is a 41 year old woman with past medical history significant for hypertension, palpitations, history of atypical chest pain who presents with chest pain, headache, and left-sided numbness.  Last known well 0530 when she began to have sharp central chest pain radiating to her back.  Over the next 30 minutes she developed a headache with numbness and tingling of her left face arm and leg.  She presented to ED in private vehicle and a stroke code was activated.  NIH stroke scale was 1 for sensory deficit.  Head CT showed no acute intracranial process.  CTA head and neck was unremarkable.  TNK was not administered due to presentation outside window.  CTA showed no LVO therefore intervention was not indicated.  Chest pain is exacerbated by moving around.  Nothing relieves it.  It is still going on.  She has had this episodically in the past for years.  Chest pain is an 8 or 9 out of 10.  CTA showed no e/o aortic dissection. EKG WNL.  She does not typically get headaches and has not had one with neurologic deficits before. Head pain is a 4 or 5 out of 10.   CNS imaging personally reviewed   ROS   Per HPI: all other systems reviewed and are negative  Past History   I have reviewed the following:  Past Medical History:  Diagnosis Date   Hypertension    Irregular heart beat    Past Surgical History:  Procedure Laterality Date   SHOULDER SURGERY Left    No family history on file. Social History   Socioeconomic History   Marital status: Married    Spouse name: Not on file   Number of children: Not on file   Years of education: Not on file   Highest education level: Not on file  Occupational History    Not on file  Tobacco Use   Smoking status: Never   Smokeless tobacco: Never  Substance and Sexual Activity   Alcohol use: Yes   Drug use: Not Currently   Sexual activity: Not on file  Other Topics Concern   Not on file  Social History Narrative   Not on file   Social Determinants of Health   Financial Resource Strain: Not on file  Food Insecurity: Not on file  Transportation Needs: Not on file  Physical Activity: Not on file  Stress: Not on file  Social Connections: Not on file   No Known Allergies  Medications   (Not in a hospital admission)     Current Facility-Administered Medications:    [START ON 08/21/2022]  stroke: early stages of recovery book, , Does not apply, Once, Derek Jack, MD   aspirin tablet 325 mg, 325 mg, Oral, Once **FOLLOWED BY** [START ON 08/21/2022] aspirin chewable tablet 81 mg, 81 mg, Oral, Daily, Su Monks M, MD   atorvastatin (LIPITOR) tablet 80 mg, 80 mg, Oral, Daily, Derek Jack, MD   clopidogrel (PLAVIX) tablet 75 mg, 75 mg, Oral, Daily, Derek Jack, MD  Current Outpatient Medications:    atorvastatin (LIPITOR) 80 MG tablet, Take 1 tablet (80 mg total) by  mouth daily for 15 days., Disp: 15 tablet, Rfl: 0   cyclobenzaprine (FLEXERIL) 10 MG tablet, Take 1 tablet by mouth 3 (three) times daily as needed., Disp: , Rfl:    DULoxetine (CYMBALTA) 30 MG capsule, Take 2 capsules by mouth in the morning and at bedtime., Disp: , Rfl:    metoprolol succinate (TOPROL-XL) 25 MG 24 hr tablet, Take 25 mg by mouth daily., Disp: , Rfl:    naproxen (NAPROSYN) 500 MG tablet, Take 1 tablet (500 mg total) by mouth 2 (two) times daily., Disp: 30 tablet, Rfl: 0  Vitals   Vitals:   08/20/22 0945 08/20/22 0947 08/20/22 1013 08/20/22 1030  BP:  119/80 137/88 120/75  Pulse:  62 66 61  Resp:  16 15 (!) 27  Temp:  98 F (36.7 C)    TempSrc:  Oral    SpO2:  97% 99% 100%  Weight: 66.4 kg     Height: 5\' 4"  (1.626 m)        Body mass index is  25.13 kg/m.  Physical Exam   Physical Exam Gen: A&O x4, NAD HEENT: Atraumatic, normocephalic;mucous membranes moist; oropharynx clear, tongue without atrophy or fasciculations. Neck: Supple, trachea midline. Resp: CTAB, no w/r/r CV: RRR, no m/g/r; nml S1 and S2. 2+ symmetric peripheral pulses. Abd: soft/NT/ND; nabs x 4 quad Extrem: Nml bulk; no cyanosis, clubbing, or edema.  Neuro: *MS: A&O x4. Follows multi-step commands.  *Speech: fluid, nondysarthric, able to name and repeat *CN:    I: Deferred   II,III: PERRLA, VFF by confrontation, optic discs unable to be visualized 2/2 pupillary constriction   III,IV,VI: EOMI w/o nystagmus, no ptosis   V: Sensation impaired on L   VII: Eyelid closure was full.  Smile symmetric.   VIII: Hearing intact to voice   IX,X: Voice normal, palate elevates symmetrically    XI: SCM/trap 5/5 bilat   XII: Tongue protrudes midline, no atrophy or fasciculations   *Motor:   Normal bulk.  No tremor, rigidity or bradykinesia. No pronator drift.    Strength: Dlt Bic Tri WrE WrF FgS Gr HF KnF KnE PlF DoF    Left 5 5 5 5 5 5 5 5 5 5 5 5     Right 5 5 5 5 5 5 5 5 5 5 5 5     *Sensory: Sensation to LT impaired LUE and LLE. Propioception intact bilat.  No double-simultaneous extinction.  *Coordination:  Finger-to-nose, heel-to-shin, rapid alternating motions were intact. *Reflexes:  2+ and symmetric throughout without clonus; toes down-going bilat *Gait: deferred  NIHSS = 1 for sensory   Premorbid mRS = 0   Labs   CBC:  Recent Labs  Lab 08/20/22 0951  WBC 4.9  NEUTROABS 2.7  HGB 11.7*  HCT 35.2*  MCV 89.8  PLT 281    Basic Metabolic Panel:  Lab Results  Component Value Date   NA 136 08/20/2022   K 3.8 08/20/2022   CO2 23 08/20/2022   GLUCOSE 111 (H) 08/20/2022   BUN 13 08/20/2022   CREATININE 0.97 08/20/2022   CALCIUM 8.8 (L) 08/20/2022   GFRNONAA >60 08/20/2022   GFRAA >60 05/17/2020   Lipid Panel:  Lab Results  Component  Value Date   LDLCALC 137 (H) 05/18/2020   HgbA1c:  Lab Results  Component Value Date   HGBA1C 5.4 05/18/2020   Urine Drug Screen:     Component Value Date/Time   LABOPIA NONE DETECTED 12/28/2020 1527   COCAINSCRNUR NONE DETECTED 12/28/2020 1527  LABBENZ NONE DETECTED 12/28/2020 1527   AMPHETMU NONE DETECTED 12/28/2020 1527   THCU NONE DETECTED 12/28/2020 1527   LABBARB NONE DETECTED 12/28/2020 1527    Alcohol Level     Component Value Date/Time   ETH <10 08/20/2022 0951   CTH - NAICP, ASPECTS 10 CTA H&N - WNL CTA c/a/p - no e/o dissection or other aortic pathology  Impression   This is a 41 year old woman with past medical history significant for hypertension, palpitations, history of atypical chest pain who presents with chest pain, headache, and left-sided numbness.  NIH stroke scale was 1 for sensory deficit.  Head CT showed no acute intracranial process.  CTA head and neck was unremarkable.  TNK was not administered due to presentation outside window.  CTA showed no LVO therefore intervention was not indicated.   Regarding her chest pain she has had similar episodes in the past. CTA showed no e/o aortic dissection. EKG WNL.  She does not typically get headaches and has not had one with neurologic deficits before. Head pain is a 4 or 5 out of 10.   Unilateral numbness is c/f TIA or small ischemic stroke, recommend admission for stroke workup. Will perform MRI brain wwo contrast to evaluate for other possible causes of acute onset sensory deficit in this age group such as demyelinating disease. Will also add on CTV to r/o venous sinus thrombosis causing headache.   Recommendations   - Admit for stroke workup - Permissive HTN x48 hrs from sx onset or until stroke ruled out by MRI goal BP <220/110. PRN labetalol or hydralazine if BP above these parameters. Avoid oral antihypertensives. - MRI brain wwo (wwo 2/2 young age, r/o stroke and alt etiologies such as demyelinating  disease) - MRV r/o venous sinus thrombosis causing headache - TTE w/ bubble - ASA 81mg  daily + plavix 75mg  daily x21 days f/b ASA 81mg  daily monotherapy after that - Atorvastatin 80mg  daily - q4 hr neuro checks - STAT head CT for any change in neuro exam - Tele - PT/OT/SLP - Stroke education - Amb referral to neurology upon discharge  Will continue to follow  ______________________________________________________________________   Thank you for the opportunity to take part in the care of this patient. If you have any further questions, please contact the neurology consultation attending.  Signed,  , MD [facility] [phone]  If 7pm- 7am, please page neurology on call as listed in [facility].  **Any copied and pasted documentation in this note was written by me in another application not billed for and pasted by me into this document.

## 2022-08-20 NOTE — ED Notes (Signed)
Called to [facility] Per RN Dee/Activated Code Stroke/Rep:Thomas 

## 2022-08-20 NOTE — Code Documentation (Signed)
Stroke Response Nurse Documentation Code Documentation  Cambrie Kitti Mcclish is a 41 y.o. female arriving to [facility] via [employer] on 08/20/2022 with past medical hx of HTN, irregular heart beat, TIA, HLD. On No antithrombotic. Code stroke was activated by ED.   Patient from home where she was LKW at [place] and now complaining of left side decreased sensation . Pt arrives to ED complaining of chest pain and left sided face, arm, and leg decreased sensation.    Stroke team at the bedside after patient returned from CT. NIHSS 1, see documentation for details and code stroke times. Patient with left decreased sensation on exam. The following imaging was completed:  CT Head and CTA. Patient is not a candidate for IV Thrombolytic due to outside window, per MD. Patient is not a candidate for IR due to no LVO suspected, per MD.   Care Plan: q2h NIHSS + vital signs.   Bedside handoff with ED RN Berenda Morale.    Charise Carwin  Stroke Response RN

## 2022-08-20 NOTE — Progress Notes (Signed)
Chaplain responded to a Code Stroke page. Observed staff evaluating patient and then spoke to patient. She said she came in on her own without family. The patient indicated she would call her husband later on. Patient was taken to CT. Will have on-call chaplain f/u  Ronny Bacon [phone]

## 2022-08-21 ENCOUNTER — Inpatient Hospital Stay
Admit: 2022-08-21 | Discharge: 2022-08-21 | Disposition: A | Discharge status: Home / Self Care | Payer: 59 | Attending: Neurology | Admitting: Neurology

## 2022-08-21 DIAGNOSIS — F419 Anxiety disorder, unspecified: Secondary | ICD-10-CM | POA: Diagnosis not present

## 2022-08-21 DIAGNOSIS — R079 Chest pain, unspecified: Secondary | ICD-10-CM | POA: Diagnosis not present

## 2022-08-21 DIAGNOSIS — R200 Anesthesia of skin: Secondary | ICD-10-CM | POA: Diagnosis not present

## 2022-08-21 DIAGNOSIS — G43009 Migraine without aura, not intractable, without status migrainosus: Secondary | ICD-10-CM

## 2022-08-21 DIAGNOSIS — E663 Overweight: Secondary | ICD-10-CM

## 2022-08-21 DIAGNOSIS — E785 Hyperlipidemia, unspecified: Secondary | ICD-10-CM | POA: Diagnosis not present

## 2022-08-21 DIAGNOSIS — G459 Transient cerebral ischemic attack, unspecified: Secondary | ICD-10-CM | POA: Diagnosis not present

## 2022-08-21 LAB — LIPID PANEL
Cholesterol: 185 mg/dL (ref 0–200)
HDL: 65 mg/dL (ref >40)
LDL Cholesterol: 112 mg/dL — ABNORMAL HIGH (ref 0–99)
Total CHOL/HDL Ratio: 2.8 RATIO
Triglycerides: 41 mg/dL (ref <150)
VLDL: 8 mg/dL (ref 0–40)

## 2022-08-21 LAB — ECHOCARDIOGRAM COMPLETE BUBBLE STUDY
AR max vel: 2.16 cm2
AV Area VTI: 2.14 cm2
AV Area mean vel: 2.11 cm2
AV Mean grad: 3 mmHg
AV Peak grad: 5.4 mmHg
Ao pk vel: 1.16 m/s
Area-P 1/2: 3.54 cm2
S' Lateral: 2.4 cm

## 2022-08-21 MED ORDER — ATORVASTATIN CALCIUM 80 MG PO TABS: 80.0000 mg | ORAL_TABLET | Freq: Every day | ORAL | 1 refills | Status: AC

## 2022-08-21 MED ORDER — CLOPIDOGREL BISULFATE 75 MG PO TABS: 75.0000 mg | ORAL_TABLET | Freq: Every day | ORAL | 1 refills | Status: AC

## 2022-08-21 NOTE — Progress Notes (Signed)
Neurology Progress Note  Subjective: numbness has nearly resolved. Patient states she has had similar numbness 4x in the past with headaches with migrainous features. She has never had the numbness without a headache. It is always on the left side when it occurs. Slight R limp working with PT, pt states is chronic. No new complaints today.  Data:  CTH CTA H&N 1. No acute intracranial abnormality. 2. No intracranial large vessel occlusion or significant stenosis. 3. No hemodynamically significant stenosis of the neck.  MRI brain wwo - no abnormalities MRV - no DVST  CNS imaging personally reviewed  TTE pending  Exam: Vitals:   08/21/22 0930 08/21/22 0957  BP: 117/79   Pulse: 64   Resp: (!) 22   Temp:  98.4 F (36.9 C)  SpO2: 97%    Physical Exam Gen: A&O x4, NAD HEENT: Atraumatic, normocephalic;mucous membranes moist; oropharynx clear, tongue without atrophy or fasciculations. Neck: Supple, trachea midline. Resp: CTAB, no w/r/r CV: RRR, no m/g/r; nml S1 and S2. 2+ symmetric peripheral pulses. Abd: soft/NT/ND; nabs x 4 quad Extrem: Nml bulk; no cyanosis, clubbing, or edema.   Neuro: *MS: A&O x4. Follows multi-step commands.  *Speech: fluid, nondysarthric, able to name and repeat *CN:    I: Deferred   II,III: PERRLA, VFF by confrontation, optic discs unable to be visualized 2/2 pupillary constriction   III,IV,VI: EOMI w/o nystagmus, no ptosis   V: Sensation minimally impaired on L   VII: Eyelid closure was full.  Smile symmetric.   VIII: Hearing intact to voice   IX,X: Voice normal, palate elevates symmetrically    XI: SCM/trap 5/5 bilat   XII: Tongue protrudes midline, no atrophy or fasciculations    *Motor:   Normal bulk.  No tremor, rigidity or bradykinesia. No pronator drift.     Strength: Dlt Bic Tri WrE WrF FgS Gr HF KnF KnE PlF DoF    Left 5 5 5 5 5 5 5 5 5 5 5 5     Right 5 5 5 5 5 5 5 5 5 5 5 5       *Sensory: SILT. Propioception intact bilat.  No  double-simultaneous extinction.  *Coordination:  Finger-to-nose, heel-to-shin, rapid alternating motions were intact. *Reflexes:  2+ and symmetric throughout without clonus; toes down-going bilat *Gait: deferred   NIHSS = 1 for sensory    Impression: 41 yo woman with pmhx significant for HTN, palpitations, atypical chest pain presented with headache and L side numbness. Stroke code was activated and workup pan-negative (TTE still pending). Patient provided additional history today that she has hx migrainous headaches and has had this same L sided numbness occur in conjunction with headache multiple times in the past. Etiology strongly favored to be complex migraine and not ischemic event.   Recommendations: - D/c ASA and plavix, presentation not consistent with TIA - F/u with PCP. Her headache frequency is not frequent enough to warrant preventive therapy, but if this changes her PCP may refer her to outpatient neurology at that time. - TTE performed, results pending. If no sig abnl, OK to d/c home from neuro standpoint.    Su Monks, MD [facility] [phone]  If 7pm- 7am, please page neurology on call as listed in [place].  **Any copied and pasted documentation in this note was written by me in another application not billed for and pasted by me into this document.

## 2022-08-21 NOTE — Progress Notes (Signed)
Physical Therapy Treatment Patient Details Name: Melinda Morris MRN: [id number] DOB: May 13, 1981 Today's Date: 08/21/2022   History of Present Illness Melinda Morris is a 41 y.o. 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 Patient also notes associated blurry vision in left eye as well as nausea. Of note patient has similar episode with admission 2021 at that time work CTH/CTA/MRI was negative and patient was discharged on ASA with diagnosis of TIA.  Patient currently states she feels much improved, notes she still feels some numbness. She denies any weakness, sob, cough. MRI negative.    PT Comments    Patient ambulating on unit without assistance, however has right knee pain and antalgic gait pattern on eval. Instructed patient in use of SPC to alleviate knee pain. She demonstrates safe use of AD and improved quality of gait. Patient has no further acute needs at this time, bus should follow up with outpatient PT for knee pain. Signing off.       Recommendations for follow up therapy are one component of a multi-disciplinary discharge planning process, led by the attending physician.  Recommendations may be updated based on patient status, additional functional criteria and insurance authorization.  Follow Up Recommendations  Outpatient PT     Assistance Recommended at Discharge PRN  Patient can return home with the following Help with stairs or ramp for entrance   Equipment Recommendations  Cane    Recommendations for Other Services       Precautions / Restrictions Precautions Precaution Comments: low fall Restrictions Weight Bearing Restrictions: No     Mobility  Bed Mobility Overal bed mobility: Independent                   Transfers Overall transfer level: Independent                      Ambulation/Gait Ambulation/Gait assistance: Modified independent (Device/Increase time) Gait Distance (Feet): 300 Feet Assistive device: Straight cane Gait Pattern/deviations: Step-through pattern Gait velocity: slightly decreased     General Gait Details: patient ambulated with cane, demonstrated improved quality of gait using cane to relieve right knee pain.   Stairs             Wheelchair Mobility    Modified Rankin (Stroke Patients Only)       Balance Overall balance assessment: Independent                                          Cognition Arousal/Alertness: Awake/alert Behavior During Therapy: WFL for tasks assessed/performed Overall Cognitive Status: Within Functional Limits for tasks assessed                                          Exercises      General Comments        Pertinent Vitals/Pain Pain Assessment Pain Assessment: Faces Faces Pain Scale: Hurts a little bit Pain Location: R knee, back Pain Descriptors / Indicators: Discomfort, Sore Pain Intervention(s): Monitored during  session    Home Living                          Prior Function            PT Goals (current goals can now be found in the care plan section) Acute Rehab PT Goals Patient Stated Goal: to feel better, go home PT Goal Formulation: With patient Time For Goal Achievement: 08/28/22 Potential to Achieve Goals: Good Progress towards PT goals: Goals met/education completed, patient discharged from PT    Frequency    Min 2X/week      PT Plan Current plan remains appropriate    Co-evaluation              AM-PAC PT "6 Clicks" Mobility   Outcome Measure  Help needed turning from your back to your side while in a flat bed without using bedrails?: None Help needed moving from lying on your back to sitting on the  side of a flat bed without using bedrails?: None Help needed moving to and from a bed to a chair (including a wheelchair)?: None Help needed standing up from a chair using your arms (e.g., wheelchair or bedside chair)?: None Help needed to walk in hospital room?: None Help needed climbing 3-5 steps with a railing? : None 6 Click Score: 24    End of Session   Activity Tolerance: Patient tolerated treatment well Patient left: in bed Nurse Communication: Mobility status PT Visit Diagnosis: Muscle weakness (generalized) (M62.81);Difficulty in walking, not elsewhere classified (R26.2);Pain Pain - Right/Left: Right Pain - part of body: Knee (back)     Time: 5301-0404 PT Time Calculation (min) (ACUTE ONLY): 9 min  Charges:  $Gait Training: 8-22 mins                     Samadhi Mahurin, PT, GCS 08/21/22,1:43 PM

## 2022-08-21 NOTE — Progress Notes (Signed)
*  PRELIMINARY RESULTS* Echocardiogram 2D Echocardiogram has been performed.  Melinda Morris 08/21/2022, 9:11 AM

## 2022-08-21 NOTE — ED Notes (Signed)
Discharge instructions reviewed with patient. Patient questions answered and opportunity for education reviewed. Patient voices understanding of discharge instructions with no further questions. Patient ambulatory with steady gait to lobby.  

## 2022-08-21 NOTE — Discharge Summary (Addendum)
Physician Discharge Summary   Patient: Melinda Morris MRN: [id number] DOB: 17-Mar-1981  Admit date:     08/20/2022  Discharge date: 08/21/2022  Discharge Physician: Hollice Espy   PCP: Sandrea Hughs, NP   Recommendations at discharge:   Patient given referral to Spanish-speaking therapist New medication: Lipitor 80 mg p.o. daily New medication: Plavix 75 mg p.o. daily  Discharge Diagnoses: Principal Problem:   TIA (transient ischemic attack) Active Problems:   Hypertension   Numbness   Hyperlipidemia   Anxiety   Overweight (BMI 25.0-29.9)   Chest pain  Resolved Problems:   * No resolved hospital problems. [facility] Hospital Course: 41 year old with past medical history of hypertension, PVCs, hyperlipidemia and TIA as well as history of COVID 2 weeks prior presented to the emergency room on 9/27 with complaints of sharp chest pain and radiation to her back along with left-sided numbness of face, left arm and left leg as well as blurry vision of left eye.  This is similar presentation in 2021 was negative and diagnosed with TIA patient started on aspirin.  Neurology consulted.  MRI brain & MRV of brain negative for acute infarct, demyelinating disease or dural venous sinus thrombosis.  Work-up in essence unremarkable.  In discussion with neurology, plan is to start patient on Plavix 75 mg p.o. daily as well as high-dose statin.  She will also get referral for therapy.  Assessment and Plan: * TIA (transient ischemic attack) Symptoms for the most part improved although there was some residual.  That said, MRI and MRV negative.  Patient went to the echo with bubble study which was also normal.  From complete preventative measures, discharged on Plavix and statin although symptoms possibly related to anxiety.  See below.  Chest pain Unremarkable.  EKG and troponins normal.  Echocardiogram normal.  Suspect anxiety.  Overweight (BMI 25.0-29.9) Meets criteria BMI greater than  25.  Anxiety Much of patient's symptoms were possibly felt to be stress mediated.  When asked about if things were stressful at home, patient came quite tearful talking about issues with her job as well as ongoing issues with her estranged husband, in regards to infidelity issues.  Patient states that she has been thinking about looking into finding a therapist who speaks [place].  Was able to find 1 who currently resides in [place] but does virtual sessions and information given to patient.  Suspect that this is the underlying cause of her chest pain and perhaps neurological symptoms as well.    Hyperlipidemia Fasting lipid panel checked and LDL at 112.  Patient started on high-dose statin.  Numbness For the most part resolved although some residual.  Suspect that this may be more anxiety and stress although to completely rule out TIA, patient went work-up which was unrevealing.  As per neurology recommendation started patient on Plavix and statin.  Hypertension Blood pressure is normal during hospitalization.  No permissive hypertension.  Continue home medications.         Consultants: Neurology Procedures performed: Echocardiogram with bubble study: Preserved ejection fraction, no diastolic dysfunction, no PFO Disposition: Home Diet recommendation:  Discharge Diet Orders (From admission, onward)     Start     Ordered   08/21/22 0000  Diet - low sodium heart healthy        08/21/22 1653           Cardiac diet DISCHARGE MEDICATION: Allergies as of 08/21/2022   No Known Allergies      Medication List  STOP taking these medications    cyclobenzaprine 10 MG tablet Commonly known as: FLEXERIL       TAKE these medications    atorvastatin 80 MG tablet Commonly known as: LIPITOR Take 1 tablet (80 mg total) by mouth daily.   clopidogrel 75 MG tablet Commonly known as: PLAVIX Take 1 tablet (75 mg total) by mouth daily.   ibuprofen 200 MG tablet Commonly  known as: ADVIL Take 200 mg by mouth every 6 (six) hours as needed.   metoprolol succinate 25 MG 24 hr tablet Commonly known as: TOPROL-XL Take 25 mg by mouth daily.        Follow-up Information     Denese Killings Follow up.   Contact information: Spanish-speaking therapist [phone] In [place], [place], but does online visits               Discharge Exam: Ceasar Mons Weights   08/20/22 0945  Weight: 66.4 kg   General: Alert and oriented x3 Cardiovascular: Regular rate and rhythm, S1 is 2 lungs: Clear auscultation bilaterally Neuro: No focal findings  Condition at discharge: good  The results of significant diagnostics from this hospitalization (including imaging, microbiology, ancillary and laboratory) are listed below for reference.   Imaging Studies: ECHOCARDIOGRAM COMPLETE BUBBLE STUDY  Result Date: 08/21/2022    ECHOCARDIOGRAM REPORT   Patient Name:   Melinda Morris Date of Exam: 08/21/2022 Medical Rec #:  [id number]            Height:       64.0 in Accession #:    [id number]           Weight:       146.4 lb Date of Birth:  12-05-1980            BSA:          1.713 m Patient Age:    41 years             BP:           98/57 mmHg Patient Gender: F                    HR:           74 bpm. Exam Location:  [facility] Procedure: 2D Echo, Color Doppler, Cardiac Doppler and Saline Contrast Bubble            Study Indications:     I63.9 Stroke  History:         Patient has prior history of Echocardiogram examinations, most                  recent 05/17/2020. Risk Factors:Hypertension.  Sonographer:     Humphrey Rolls Referring Phys:  [id number] Malachi Carl STACK Diagnosing Phys: Alwyn Pea MD IMPRESSIONS  1. Left ventricular ejection fraction, by estimation, is 70 to 75%. The left ventricle has hyperdynamic function. The left ventricle has no regional wall motion abnormalities. Left ventricular diastolic parameters are consistent with Grade II diastolic dysfunction  (pseudonormalization).  2. Right ventricular systolic function is normal. The right ventricular size is normal.  3. The mitral valve is normal in structure. Trivial mitral valve regurgitation.  4. The aortic valve is normal in structure. Aortic valve regurgitation is not visualized. FINDINGS  Left Ventricle: Left ventricular ejection fraction, by estimation, is 70 to 75%. The left ventricle has hyperdynamic function. The left ventricle has no regional wall motion abnormalities. The left ventricular internal cavity size was normal in  size. There is no left ventricular hypertrophy. Left ventricular diastolic parameters are consistent with Grade II diastolic dysfunction (pseudonormalization). Right Ventricle: The right ventricular size is normal. No increase in right ventricular wall thickness. Right ventricular systolic function is normal. Left Atrium: Left atrial size was normal in size. Right Atrium: Right atrial size was normal in size. Pericardium: There is no evidence of pericardial effusion. Mitral Valve: The mitral valve is normal in structure. Trivial mitral valve regurgitation. Tricuspid Valve: The tricuspid valve is normal in structure. Tricuspid valve regurgitation is trivial. Aortic Valve: The aortic valve is normal in structure. Aortic valve regurgitation is not visualized. Aortic valve mean gradient measures 3.0 mmHg. Aortic valve peak gradient measures 5.4 mmHg. Aortic valve area, by VTI measures 2.14 cm. Pulmonic Valve: The pulmonic valve was normal in structure. Pulmonic valve regurgitation is not visualized. Aorta: The ascending aorta was not well visualized. IAS/Shunts: No atrial level shunt detected by color flow Doppler. Agitated saline contrast was given intravenously to evaluate for intracardiac shunting.  LEFT VENTRICLE PLAX 2D LVIDd:         4.20 cm   Diastology LVIDs:         2.40 cm   LV e' medial:    9.36 cm/s LV PW:         0.80 cm   LV E/e' medial:  7.8 LV IVS:        0.70 cm   LV e'  lateral:   15.60 cm/s LVOT diam:     1.80 cm   LV E/e' lateral: 4.7 LV SV:         52 LV SV Index:   30 LVOT Area:     2.54 cm  RIGHT VENTRICLE RV Basal diam:  3.00 cm RV S prime:     12.60 cm/s TAPSE (M-mode): 3.0 cm LEFT ATRIUM           Index        RIGHT ATRIUM           Index LA diam:      3.40 cm 1.98 cm/m   RA Area:     11.20 cm LA Vol (A2C): 23.5 ml 13.72 ml/m  RA Volume:   20.10 ml  11.73 ml/m LA Vol (A4C): 34.2 ml 19.96 ml/m  AORTIC VALVE                    PULMONIC VALVE AV Area (Vmax):    2.16 cm     PV Vmax:       0.96 m/s AV Area (Vmean):   2.11 cm     PV Peak grad:  3.6 mmHg AV Area (VTI):     2.14 cm AV Vmax:           116.00 cm/s AV Vmean:          76.400 cm/s AV VTI:            0.244 m AV Peak Grad:      5.4 mmHg AV Mean Grad:      3.0 mmHg LVOT Vmax:         98.50 cm/s LVOT Vmean:        63.200 cm/s LVOT VTI:          0.205 m LVOT/AV VTI ratio: 0.84  AORTA Ao Root diam: 2.50 cm MITRAL VALVE MV Area (PHT): 3.54 cm    SHUNTS MV Decel Time: 214 msec    Systemic VTI:  0.20 m MV E  velocity: 72.70 cm/s  Systemic Diam: 1.80 cm MV A velocity: 45.20 cm/s MV E/A ratio:  1.61 Alwyn Peawayne D Callwood MD Electronically signed by Alwyn Peawayne D Callwood MD Signature Date/Time: 08/21/2022/5:34:58 PM    Final    MR BRAIN W WO CONTRAST  Result Date: 08/20/2022 CLINICAL DATA:  Left sided weakness. TIA. Rule out demyelinating disease. EXAM: MRI HEAD WITHOUT AND WITH CONTRAST MR VENOGRAM HEAD WITHOUT AND WITH CONTRAST TECHNIQUE: Multiplanar, multi-echo pulse sequences of the brain and surrounding structures were acquired without and with intravenous contrast. Angiographic images of the intracranial venous structures were acquired using MRV technique without and with intravenous contrast. CONTRAST:  6 ml vueway COMPARISON:  None Available. FINDINGS: MRI HEAD WITHOUT AND WITH CONTRAST Brain: No acute infarction, hemorrhage, hydrocephalus, extra-axial collection or mass lesion. No pathologic intracranial enhancement.  Vascular: Normal flow voids. Skull and upper cervical spine: Normal marrow signal. Sinuses/Orbits: No acute or significant finding. Other: None. MR VENOGRAM HEAD WITHOUT AND WITH CONTRAST There is no evidence of dural venous sinus or deep cerebral vein thrombosis. No dural venous sinus stenosis. IMPRESSION: 1.  Negative for acute infarct or demyelinating disease. 2.  Negative for dural venous sinus thrombosis. Electronically Signed   By: Lorenza CambridgeHemant  Desai M.D.   On: 08/20/2022 15:03   MR MRV HEAD W WO CONTRAST  Result Date: 08/20/2022 CLINICAL DATA:  Left sided weakness. TIA. Rule out demyelinating disease. EXAM: MRI HEAD WITHOUT AND WITH CONTRAST MR VENOGRAM HEAD WITHOUT AND WITH CONTRAST TECHNIQUE: Multiplanar, multi-echo pulse sequences of the brain and surrounding structures were acquired without and with intravenous contrast. Angiographic images of the intracranial venous structures were acquired using MRV technique without and with intravenous contrast. CONTRAST:  6 ml vueway COMPARISON:  None Available. FINDINGS: MRI HEAD WITHOUT AND WITH CONTRAST Brain: No acute infarction, hemorrhage, hydrocephalus, extra-axial collection or mass lesion. No pathologic intracranial enhancement. Vascular: Normal flow voids. Skull and upper cervical spine: Normal marrow signal. Sinuses/Orbits: No acute or significant finding. Other: None. MR VENOGRAM HEAD WITHOUT AND WITH CONTRAST There is no evidence of dural venous sinus or deep cerebral vein thrombosis. No dural venous sinus stenosis. IMPRESSION: 1.  Negative for acute infarct or demyelinating disease. 2.  Negative for dural venous sinus thrombosis. Electronically Signed   By: Lorenza CambridgeHemant  Desai M.D.   On: 08/20/2022 15:03   DG Chest 2 View  Result Date: 08/20/2022 CLINICAL DATA:  Chest pain and dizziness. EXAM: CHEST - 2 VIEW COMPARISON:  09/03/2014 FINDINGS: Heart size and mediastinal contours appear normal. No pleural effusion or edema. No airspace opacities identified.  Calcified granuloma noted in the left mid lung. Visualized osseous structures appear unremarkable. IMPRESSION: No acute cardiopulmonary abnormalities. Electronically Signed   By: Signa Kellaylor  Stroud M.D.   On: 08/20/2022 11:39   CT ANGIO HEAD NECK W WO CM (CODE STROKE)  Result Date: 08/20/2022 CLINICAL DATA:  Stroke suspected EXAM: CT ANGIOGRAPHY HEAD AND NECK TECHNIQUE: Multidetector CT imaging of the head and neck was performed using the standard protocol during bolus administration of intravenous contrast. Multiplanar CT image reconstructions and MIPs were obtained to evaluate the vascular anatomy. Carotid stenosis measurements (when applicable) are obtained utilizing NASCET criteria, using the distal internal carotid diameter as the denominator. RADIATION DOSE REDUCTION: This exam was performed according to the departmental dose-optimization program which includes automated exposure control, adjustment of the mA and/or kV according to patient size and/or use of iterative reconstruction technique. CONTRAST:  150mL OMNIPAQUE IOHEXOL 350 MG/ML SOLN COMPARISON:  Prior CT head and  neck angiogram 05/17/2020 FINDINGS: CT HEAD FINDINGS Brain: No evidence of acute infarction, hemorrhage, hydrocephalus, extra-axial collection or mass lesion/mass effect. Vascular: See below Skull: Normal. Negative for fracture or focal lesion. Sinuses/Orbits: No acute finding. Other: None. Review of the MIP images confirms the above findings CTA NECK FINDINGS Aortic arch: 2 vessel aortic arch. Imaged portion shows no evidence of aneurysm or dissection. No significant stenosis of the major arch vessel origins. Right carotid system: No evidence of dissection, stenosis (50% or greater), or occlusion. Left carotid system: No evidence of dissection, stenosis (50% or greater), or occlusion. Vertebral arteries: Codominant. No evidence of dissection, stenosis (50% or greater), or occlusion. Skeleton: Negative. Other neck: Negative. Upper chest: See  separately dictated CT chest abdomen and pelvis. Review of the MIP images confirms the above findings CTA HEAD FINDINGS Anterior circulation: No significant stenosis, proximal occlusion, aneurysm, or vascular malformation. Posterior circulation: No significant stenosis, proximal occlusion, aneurysm, or vascular malformation. Venous sinuses: As permitted by contrast timing, patent. Anatomic variants: Fetal type PCA on the right Review of the MIP images confirms the above findings IMPRESSION: 1. No acute intracranial abnormality. 2. No intracranial large vessel occlusion or significant stenosis. 3. No hemodynamically significant stenosis of the neck. Electronically Signed   By: Marin Roberts M.D.   On: 08/20/2022 11:07   CT Angio Chest/Abd/Pel for Dissection W and/or W/WO  Result Date: 08/20/2022 CLINICAL DATA:  Acute aortic dissection suspected EXAM: CT ANGIOGRAPHY CHEST, ABDOMEN AND PELVIS TECHNIQUE: Non-contrast CT of the chest was initially obtained. Multidetector CT imaging through the chest, abdomen and pelvis was performed using the standard protocol during bolus administration of intravenous contrast. Multiplanar reconstructed images and MIPs were obtained and reviewed to evaluate the vascular anatomy. RADIATION DOSE REDUCTION: This exam was performed according to the departmental dose-optimization program which includes automated exposure control, adjustment of the mA and/or kV according to patient size and/or use of iterative reconstruction technique. CONTRAST:  185mL OMNIPAQUE IOHEXOL 350 MG/ML SOLN COMPARISON:  None Available. FINDINGS: CTA CHEST FINDINGS VASCULAR Aorta: Satisfactory opacification of the aorta. Normal contour and caliber of the thoracic aorta. No evidence of aneurysm, dissection, or other acute aortic pathology. Cardiovascular: No evidence of pulmonary embolism on limited non-tailored examination. Normal heart size. No pericardial effusion. Review of the MIP images confirms the above  findings. NON VASCULAR Mediastinum/Nodes: No enlarged mediastinal, hilar, or axillary lymph nodes. Thyroid gland, trachea, and esophagus demonstrate no significant findings. Lungs/Pleura: Lungs are clear. Small, benign calcified nodule of the peripheral left upper lobe, for which no further follow-up or characterization is required. No pleural effusion or pneumothorax. Musculoskeletal: No chest wall abnormality. No acute osseous findings. Review of the MIP images confirms the above findings. CTA ABDOMEN AND PELVIS FINDINGS VASCULAR Normal contour and caliber of the abdominal aorta. No evidence of aneurysm, dissection, or other acute aortic pathology. Standard branching pattern of the abdominal aorta with solitary bilateral renal arteries. Review of the MIP images confirms the above findings. NON-VASCULAR Hepatobiliary: No solid liver abnormality is seen. No gallstones, gallbladder wall thickening, or biliary dilatation. Pancreas: Unremarkable. No pancreatic ductal dilatation or surrounding inflammatory changes. Spleen: Normal in size without significant abnormality. Adrenals/Urinary Tract: Adrenal glands are unremarkable. Excreted contrast in the renal collecting systems from previous contrast bolus. Kidneys are otherwise normal, without renal calculi, solid lesion, or hydronephrosis. Bladder is unremarkable. Stomach/Bowel: Stomach is within normal limits. Appendix appears normal. No evidence of bowel wall thickening, distention, or inflammatory changes. Lymphatic: No enlarged abdominal or pelvic lymph nodes.  Reproductive: No mass or other significant abnormality. Other: No abdominal wall hernia or abnormality. No ascites. Musculoskeletal: No acute osseous findings. IMPRESSION: Normal contour and caliber of the thoracic and abdominal aorta. No evidence of aneurysm, dissection, or other acute aortic pathology. No significant atherosclerosis. Electronically Signed   By: Jearld Lesch M.D.   On: 08/20/2022 11:03   CT  HEAD CODE STROKE WO CONTRAST  Result Date: 08/20/2022 CLINICAL DATA:  Code stroke.  Neuro deficit, acute, stroke suspected EXAM: CT HEAD WITHOUT CONTRAST TECHNIQUE: Contiguous axial images were obtained from the base of the skull through the vertex without intravenous contrast. RADIATION DOSE REDUCTION: This exam was performed according to the departmental dose-optimization program which includes automated exposure control, adjustment of the mA and/or kV according to patient size and/or use of iterative reconstruction technique. COMPARISON:  CT head December 28, 2020. FINDINGS: Brain: No evidence of acute large vascular territory infarction, hemorrhage, hydrocephalus, extra-axial collection or mass lesion/mass effect. Vascular: No hyperdense vessel identified. Skull: No acute fracture. Sinuses/Orbits: Largely clear sinuses.  No acute orbital findings. Other: No mastoid effusions. ASPECTS [facility] Stroke Program Early CT Score) total score (0-10 with 10 being normal): 10. IMPRESSION: 1. No evidence of acute intracranial abnormality. 2. ASPECTS is 10. Code stroke imaging results were communicated on 08/20/2022 at 10:09 am to provider [facility] via telephone, who verbally acknowledged these results. Electronically Signed   By: Feliberto Harts M.D.   On: 08/20/2022 10:09    Microbiology: Results for orders placed or performed during the hospital encounter of 08/20/22  Resp Panel by RT-PCR (Flu A&B, Covid) Anterior Nasal Swab     Status: None   Collection Time: 08/20/22 10:57 AM   Specimen: Anterior Nasal Swab  Result Value Ref Range Status   SARS Coronavirus 2 by RT PCR NEGATIVE NEGATIVE Final    Comment: (NOTE) SARS-CoV-2 target nucleic acids are NOT DETECTED.  The SARS-CoV-2 RNA is generally detectable in upper respiratory specimens during the acute phase of infection. The lowest concentration of SARS-CoV-2 viral copies this assay can detect is 138 copies/mL. A negative result does not preclude  SARS-Cov-2 infection and should not be used as the sole basis for treatment or other patient management decisions. A negative result may occur with  improper specimen collection/handling, submission of specimen other than nasopharyngeal swab, presence of viral mutation(s) within the areas targeted by this assay, and inadequate number of viral copies(<138 copies/mL). A negative result must be combined with clinical observations, patient history, and epidemiological information. The expected result is Negative.  Fact Sheet for Patients:  BloggerCourse.com  Fact Sheet for Healthcare Providers:  SeriousBroker.it  This test is no t yet approved or cleared by the [place] FDA and  has been authorized for detection and/or diagnosis of SARS-CoV-2 by FDA under an Emergency Use Authorization (EUA). This EUA will remain  in effect (meaning this test can be used) for the duration of the COVID-19 declaration under Section 564(b)(1) of the Act, 21 U.S.C.section 360bbb-3(b)(1), unless the authorization is terminated  or revoked sooner.       Influenza A by PCR NEGATIVE NEGATIVE Final   Influenza B by PCR NEGATIVE NEGATIVE Final    Comment: (NOTE) The Xpert Xpress SARS-CoV-2/FLU/RSV plus assay is intended as an aid in the diagnosis of influenza from Nasopharyngeal swab specimens and should not be used as a sole basis for treatment. Nasal washings and aspirates are unacceptable for Xpert Xpress SARS-CoV-2/FLU/RSV testing.  Fact Sheet for Patients: BloggerCourse.com  Fact Sheet for  Healthcare Providers: SeriousBroker.it  This test is not yet approved or cleared by the [place] and has been authorized for detection and/or diagnosis of SARS-CoV-2 by FDA under an Emergency Use Authorization (EUA). This EUA will remain in effect (meaning this test can be used) for the duration of  the COVID-19 declaration under Section 564(b)(1) of the Act, 21 U.S.C. section 360bbb-3(b)(1), unless the authorization is terminated or revoked.  Performed at [facility], [address] [place], [place] [address]     Labs: CBC: Recent Labs  Lab 08/20/22 0951  WBC 4.9  NEUTROABS 2.7  HGB 11.7*  HCT 35.2*  MCV 89.8  PLT 281   Basic Metabolic Panel: Recent Labs  Lab 08/20/22 0951  NA 136  K 3.8  CL 104  CO2 23  GLUCOSE 111*  BUN 13  CREATININE 0.97  CALCIUM 8.8*   Liver Function Tests: Recent Labs  Lab 08/20/22 0951  AST 19  ALT 18  ALKPHOS 47  BILITOT 0.7  PROT 7.4  ALBUMIN 4.4   CBG: Recent Labs  Lab 08/20/22 0951  GLUCAP 149*    Discharge time spent: greater than 30 minutes.  Signed: Hollice Espy, MD [facility] 08/23/2022

## 2022-08-21 NOTE — Hospital Course (Addendum)
41 year old with past medical history of hypertension, PVCs, hyperlipidemia and TIA as well as history of COVID 2 weeks prior presented to the emergency room on 9/27 with complaints of sharp chest pain and radiation to her back along with left-sided numbness of face, left arm and left leg as well as blurry vision of left eye.  This is similar presentation in 2021 was negative and diagnosed with TIA patient started on aspirin.  Neurology consulted.  MRI brain & MRV of brain negative for acute infarct, demyelinating disease or dural venous sinus thrombosis.  Work-up in essence unremarkable.  In discussion with neurology, plan is to start patient on Plavix 75 mg p.o. daily as well as high-dose statin.  She will also get referral for therapy.

## 2022-08-23 NOTE — Assessment & Plan Note (Signed)
Much of patient's symptoms were possibly felt to be stress mediated.  When asked about if things were stressful at home, patient came quite tearful talking about issues with her job as well as ongoing issues with her estranged husband, in regards to infidelity issues.  Patient states that she has been thinking about looking into finding a therapist who speaks [place].  Was able to find 1 who currently resides in [place] but does virtual sessions and information given to patient.  Suspect that this is the underlying cause of her chest pain and perhaps neurological symptoms as well.

## 2022-08-23 NOTE — Assessment & Plan Note (Signed)
Unremarkable.  EKG and troponins normal.  Echocardiogram normal.  Suspect anxiety.

## 2022-08-23 NOTE — Assessment & Plan Note (Signed)
Meets criteria BMI greater than 25 

## 2022-08-23 NOTE — Assessment & Plan Note (Signed)
For the most part resolved although some residual.  Suspect that this may be more anxiety and stress although to completely rule out TIA, patient went work-up which was unrevealing.  As per neurology recommendation started patient on Plavix and statin.

## 2022-08-23 NOTE — Assessment & Plan Note (Signed)
Symptoms for the most part improved although there was some residual.  That said, MRI and MRV negative.  Patient went to the echo with bubble study which was also normal.  From complete preventative measures, discharged on Plavix and statin although symptoms possibly related to anxiety.  See below.

## 2022-08-23 NOTE — Assessment & Plan Note (Signed)
Blood pressure is normal during hospitalization.  No permissive hypertension.  Continue home medications.

## 2022-08-23 NOTE — Assessment & Plan Note (Signed)
Fasting lipid panel checked and LDL at 112.  Patient started on high-dose statin.
